# Patient Record
Sex: Male | Born: 1995 | Race: White | Hispanic: No | Marital: Single | Smoking: Never smoker
Health system: Southern US, Community
[De-identification: ages and names within clinical notes are randomized; demographics above are authoritative.]

## PROBLEM LIST (undated history)

## (undated) DIAGNOSIS — F419 Anxiety disorder, unspecified: Secondary | ICD-10-CM

## (undated) DIAGNOSIS — F909 Attention-deficit hyperactivity disorder, unspecified type: Secondary | ICD-10-CM

## (undated) DIAGNOSIS — F840 Autistic disorder: Secondary | ICD-10-CM

## (undated) DIAGNOSIS — Z8489 Family history of other specified conditions: Secondary | ICD-10-CM

## (undated) DIAGNOSIS — I10 Essential (primary) hypertension: Secondary | ICD-10-CM

## (undated) HISTORY — PX: ADENOIDECTOMY: SUR15

## (undated) HISTORY — PX: TYMPANOSTOMY TUBE PLACEMENT: SHX32

---
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
## 2015-02-02 ENCOUNTER — Institutional Professional Consult (permissible substitution): Payer: Managed Care, Other (non HMO) | Admitting: Pediatrics

## 2015-02-02 DIAGNOSIS — F840 Autistic disorder: Secondary | ICD-10-CM | POA: Diagnosis not present

## 2015-02-02 DIAGNOSIS — F411 Generalized anxiety disorder: Secondary | ICD-10-CM | POA: Diagnosis not present

## 2015-02-02 DIAGNOSIS — F902 Attention-deficit hyperactivity disorder, combined type: Secondary | ICD-10-CM | POA: Diagnosis not present

## 2015-04-27 ENCOUNTER — Institutional Professional Consult (permissible substitution) (INDEPENDENT_AMBULATORY_CARE_PROVIDER_SITE_OTHER): Payer: Managed Care, Other (non HMO) | Admitting: Pediatrics

## 2015-04-27 DIAGNOSIS — F902 Attention-deficit hyperactivity disorder, combined type: Secondary | ICD-10-CM | POA: Diagnosis not present

## 2015-04-27 DIAGNOSIS — F840 Autistic disorder: Secondary | ICD-10-CM | POA: Diagnosis not present

## 2015-05-12 ENCOUNTER — Encounter (HOSPITAL_COMMUNITY): Payer: Self-pay | Admitting: *Deleted

## 2015-05-12 ENCOUNTER — Emergency Department (HOSPITAL_COMMUNITY): Payer: Managed Care, Other (non HMO)

## 2015-05-12 ENCOUNTER — Emergency Department (HOSPITAL_COMMUNITY)
Admission: EM | Admit: 2015-05-12 | Discharge: 2015-05-13 | Disposition: A | Discharge status: Home / Self Care | Payer: Managed Care, Other (non HMO) | Attending: Emergency Medicine | Admitting: Emergency Medicine

## 2015-05-12 DIAGNOSIS — Y9389 Activity, other specified: Secondary | ICD-10-CM | POA: Insufficient documentation

## 2015-05-12 DIAGNOSIS — F840 Autistic disorder: Secondary | ICD-10-CM | POA: Diagnosis not present

## 2015-05-12 DIAGNOSIS — Y92009 Unspecified place in unspecified non-institutional (private) residence as the place of occurrence of the external cause: Secondary | ICD-10-CM | POA: Diagnosis not present

## 2015-05-12 DIAGNOSIS — Z79899 Other long term (current) drug therapy: Secondary | ICD-10-CM | POA: Diagnosis not present

## 2015-05-12 DIAGNOSIS — W091XXA Fall from playground swing, initial encounter: Secondary | ICD-10-CM | POA: Diagnosis not present

## 2015-05-12 DIAGNOSIS — S82841A Displaced bimalleolar fracture of right lower leg, initial encounter for closed fracture: Secondary | ICD-10-CM

## 2015-05-12 DIAGNOSIS — Y998 Other external cause status: Secondary | ICD-10-CM | POA: Diagnosis not present

## 2015-05-12 DIAGNOSIS — S99911A Unspecified injury of right ankle, initial encounter: Secondary | ICD-10-CM | POA: Diagnosis present

## 2015-05-12 HISTORY — DX: Autistic disorder: F84.0

## 2015-05-12 MED ORDER — PROPOFOL 10 MG/ML IV BOLUS: 2.0000 mg/kg | Freq: Once | INTRAVENOUS | Status: DC

## 2015-05-12 MED ORDER — ETOMIDATE 2 MG/ML IV SOLN
0.3000 mg/kg | Freq: Once | INTRAVENOUS | Status: AC
  Filled 2015-05-12: qty 20

## 2015-05-12 MED ORDER — MIDAZOLAM HCL 2 MG/2ML IJ SOLN
1.0000 mg | Freq: Once | INTRAMUSCULAR | Status: AC
  Filled 2015-05-12: qty 2

## 2015-05-12 MED ORDER — MIDAZOLAM HCL 2 MG/2ML IJ SOLN
INTRAMUSCULAR | Status: AC | PRN
  Administered 2015-05-12: 1 mg via INTRAVENOUS

## 2015-05-12 MED ORDER — HYDROCODONE-ACETAMINOPHEN 5-325 MG PO TABS: 2.0000 | ORAL_TABLET | ORAL | Status: DC | PRN

## 2015-05-12 MED ORDER — MIDAZOLAM HCL 2 MG/2ML IJ SOLN
INTRAMUSCULAR | Status: DC | PRN
  Administered 2015-05-12: 1 mg via INTRAVENOUS

## 2015-05-12 MED ORDER — ETOMIDATE 2 MG/ML IV SOLN
INTRAVENOUS | Status: AC | PRN
  Administered 2015-05-12: 10 mg via INTRAVENOUS

## 2015-05-12 MED ORDER — LIDOCAINE HCL (PF) 1 % IJ SOLN
10.0000 mL | Freq: Once | INTRAMUSCULAR | Status: DC
  Filled 2015-05-12: qty 10

## 2015-05-12 NOTE — Progress Notes (Signed)
Orthopedic Tech Progress Note Patient Details:  Fernando Davies Jul 31, 1995 [id number]  Ortho Devices Type of Ortho Device: Ace wrap, Post (short leg) splint, Stirrup splint Ortho Device/Splint Location: RLE Ortho Device/Splint Interventions: Ordered, Application   Jennye Moccasin 05/12/2015, 10:34 PM

## 2015-05-12 NOTE — ED Notes (Signed)
Pt to ED from home by EMS with ankle injury. Pt was swinging on a swing at home when the chain broke, pt's ankle was up under the swing. Splint in place, obvious deformity noted to R ankle. EMS gave of fentanyl enroute. CMS intact

## 2015-05-12 NOTE — ED Provider Notes (Signed)
CSN: [id number]     Arrival date & time 05/12/15  2003 History   First MD Initiated Contact with Patient 05/12/15 2006     Chief Complaint  Patient presents with  . Ankle Injury     (Consider location/radiation/quality/duration/timing/severity/associated sxs/prior Treatment) HPI  20 y M w PMH autism, no known allergies who was on a swing this evening when the chain holding the swing broke and he landed on his R foot.  Obvious deformity to R ankle was noticed by ems.  He was HDS en route to the hospital and received fentanyl PTA.  He is complaining of pain around his R ankle, that is 4/10, no other sx.  Past Medical History  Diagnosis Date  . Autism    Past Surgical History  Procedure Laterality Date  . Tympanostomy tube placement    . Adenoidectomy     History reviewed. No pertinent family history. Social History  Substance Use Topics  . Smoking status: Never Smoker   . Smokeless tobacco: None  . Alcohol Use: No    Review of Systems  Constitutional: Negative for fever and chills.  Eyes: Negative for redness.  Respiratory: Negative for cough and shortness of breath.   Cardiovascular: Negative for chest pain.  Gastrointestinal: Negative for nausea, vomiting, abdominal pain and diarrhea.  Genitourinary: Negative for dysuria.  Musculoskeletal:       R ankle pain  Skin: Negative for rash.  Neurological: Negative for headaches.  All other systems reviewed and are negative.     Allergies  Review of patient's allergies indicates no known allergies.  Home Medications   Prior to Admission medications   Medication Sig Start Date End Date Taking? Authorizing Provider  cloNIDine (CATAPRES) 0.3 MG tablet Take 1 tablet by mouth daily. 04/27/15  Yes Historical Provider, MD  FLUoxetine (PROZAC) 20 MG tablet Take 1 tablet by mouth daily. 04/27/15  Yes Historical Provider, MD  Melatonin 10 MG TABS Take 1 tablet by mouth daily.   Yes Historical Provider, MD  methylphenidate 54 MG PO  CR tablet Take 1 tablet by mouth daily. 05/08/15  Yes Historical Provider, MD  HYDROcodone-acetaminophen (NORCO/VICODIN) 5-325 MG tablet Take 2 tablets by mouth every 4 (four) hours as needed. 05/12/15   Silas Flood, MD   BP 138/98 mmHg  Pulse 90  Temp(Src) 98.5 F (36.9 C) (Oral)  Resp 18  Ht  (1.575 m)  Wt 74.844 kg  BMI 30.17 kg/m2  SpO2 99% Physical Exam  Constitutional: He is oriented to person, place, and time. No distress.  HENT:  Head: Normocephalic and atraumatic.  Eyes: EOM are normal. Pupils are equal, round, and reactive to light.  Neck: Normal range of motion. Neck supple.  Cardiovascular: Normal rate.   Pulmonary/Chest: Effort normal. No respiratory distress.  Abdominal: Soft. There is no tenderness.  Musculoskeletal: Normal range of motion.  Palpable R dp pulse.  Intact motor/sensory function in R foot.  Obvious deformity of R ankle with some blanched skin around the medial malleolus.  Neurological: He is alert and oriented to person, place, and time.  Skin: No rash noted. He is not diaphoretic.  Psychiatric: He has a normal mood and affect.    ED Course  .Sedation Date/Time: 05/13/2015 12:40 AM Performed by: Silas Flood Authorized by: Silas Flood  Consent:    Consent obtained:  Verbal and written   Consent given by:  Patient and parent   Risks discussed:  Allergic reaction, prolonged hypoxia resulting in organ damage, prolonged sedation  necessitating reversal, respiratory compromise necessitating ventilatory assistance and intubation, nausea and vomiting   Alternatives discussed:  Analgesia without sedation Indications:    Sedation purpose:  Fracture reduction   Procedure necessitating sedation performed by:  Physician performing sedation   Intended level of sedation:  Moderate (conscious sedation) Pre-sedation assessment:    ASA classification: class 1 - normal, healthy patient     Neck mobility: normal     Mouth opening:  3 or more finger widths    Mallampati score:  I - soft palate, uvula, fauces, pillars visible Immediate pre-procedure details:    Reviewed: vital signs     Verified: bag valve mask available, emergency equipment available, intubation equipment available, IV patency confirmed and oxygen available   Procedure details (see MAR for exact dosages):    Sedation:  Etomidate   Intra-procedure events: none   Reduction of fracture Date/Time: 05/13/2015 12:42 AM Performed by: Silas Flood Authorized by: Silas Flood Consent: Verbal consent obtained. Risks and benefits: risks, benefits and alternatives were discussed Consent given by: patient and parent Patient identity confirmed: verbally with patient Time out: Immediately prior to procedure a "time out" was called to verify the correct patient, procedure, equipment, support staff and site/side marked as required. Patient sedated: yes Sedatives: etomidate Patient tolerance: Patient tolerated the procedure well with no immediate complications   (including critical care time) Labs Review Labs Reviewed - No data to display  Imaging Review Dg Tibia/fibula Right  05/12/2015  CLINICAL DATA:  20 year old male with history of trauma after jumping off of a swing landing heart onto the right ankle. EXAM: RIGHT TIBIA AND FIBULA - 2 VIEW COMPARISON:  No priors. FINDINGS: Three views of the right tibia and fibula demonstrate an acute fracture/ dislocation of the right ankle joint involving the medial and lateral malleoli. Proximal aspects of the tibia and fibula are otherwise intact. IMPRESSION: Acute bimalleolar fracture dislocation of the right ankle, fully described on associated ankle radiograph 05/12/2015, see that report for full details. Proximal right tibia and fibula are otherwise intact. Electronically Signed   By: Trudie Reed M.D.   On: 05/12/2015 21:00   Dg Ankle 2 Views Right  05/12/2015  CLINICAL DATA:  Status post reduction of ankle fracture. Initial encounter. EXAM:  RIGHT ANKLE - 2 VIEW COMPARISON:  Right ankle radiographs performed earlier today at 8:29 p.m. FINDINGS: There is has been interval reduction of the talus, with mild residual lateral displacement, and medial widening of the ankle mortise. There is mild residual lateral displacement of the medial malleolus, and mild posterior displacement of the lateral malleolar and posterior malleolar fragments. Thin osseous fragments projecting anterior to the talar dome may arise from the fibular fracture. Surrounding soft tissue swelling is noted. The soft tissues are difficult to fully assess due to the overlying splint. IMPRESSION: Interval reduction of the talus, with mild residual lateral displacement, and medial widening of the ankle mortise. Mild residual lateral displacement of the medial malleolus, and mild posterior displacement of lateral malleolar and posterior malleolar fragments. Thin osseous fragments projecting anterior to the talar dome may arise from the fibular fracture. Electronically Signed   By: Roanna Raider M.D.   On: 05/12/2015 22:45   Dg Ankle Complete Right  05/12/2015  CLINICAL DATA:  20 year old male with history of trauma after jumping off a swing landing on the right ankle. EXAM: RIGHT ANKLE - COMPLETE 3+ VIEW COMPARISON:  No priors. FINDINGS: Widely displaced lateral malleolus fracture with approximately 15 mm of lateral displacement.  Displaced and angulated fracture of the medial malleoli this, which is approximately 1/2 shaft width laterally displaced and angulated approximately 30 degrees laterally. Total disruption of the ankle mortise, with lateral dislocation of the talus relative to the distal tibia approximately 2 cm. Extensive soft tissue swelling around the ankle joint. IMPRESSION: 1. Severe bimalleolar fracture dislocation of the right ankle, as above. Electronically Signed   By: Trudie Reed M.D.   On: 05/12/2015 20:58   I have personally reviewed and evaluated these images  and lab results as part of my medical decision-making.   EKG Interpretation None      MDM   Final diagnoses:  Ankle fracture, bimalleolar, closed, right, initial encounter    3 y M w PMH autism, no known allergies who was on a swing this evening when the chain holding the swing broke and he landed on his R foot.   Obvious deformity to the R ankle  Plain films show R ankle frx.  Have done procedural sedation with reduction as documented above and placed ankle in splint.  Post-reduction films with good reduction.  Patient woke up from sedation without issue.  Post-reduction exam with good cap-refill in the toes and intact motor/sensory function in the toes of the R foot.  Will have patient f/u with ortho  I have discussed the results, Dx and Tx plan with the patient. They expressed understanding and agree with the plan and were told to return to ED with any worsening of condition or concern.    Disposition: Discharge  Condition: Good  New Prescriptions   HYDROCODONE-ACETAMINOPHEN (NORCO/VICODIN) 5-325 MG TABLET    Take 2 tablets by mouth every 4 (four) hours as needed.    Follow Up: Kathryne Hitch, MD [address] [place] [address] [phone]  In 1 week Please call clinic tomorrow to make an appointment   Pt seen in conjunction with Dr. Marga Hoots, MD 05/13/15 2130  Arby Barrette, MD 05/16/15 1137

## 2015-05-12 NOTE — Discharge Instructions (Signed)
Ankle Fracture °A fracture is a break in a bone. The ankle joint is made up of three bones. These include the lower (distal) sections of your lower leg bones, called the tibia and fibula, along with a bone in your foot, called the talus. Depending on how bad the break is and if more than one ankle joint bone is broken, a cast or splint is used to protect and keep your injured bone from moving while it heals. Sometimes, surgery is required to help the fracture heal properly.  °There are two general types of fractures: °· Stable fracture. This includes a single fracture line through one bone, with no injury to ankle ligaments. A fracture of the talus that does not have any displacement (movement of the bone on either side of the fracture line) is also stable. °· Unstable fracture. This includes more than one fracture line through one or more bones in the ankle joint. It also includes fractures that have displacement of the bone on either side of the fracture line. °CAUSES °· A direct blow to the ankle.   °· Quickly and severely twisting your ankle. °· Trauma, such as a car accident or falling from a significant height. °RISK FACTORS °You may be at a higher risk of ankle fracture if: °· You have certain medical conditions. °· You are involved in high-impact sports. °· You are involved in a high-impact car accident. °SIGNS AND SYMPTOMS  °· Tender and swollen ankle. °· Bruising around the injured ankle. °· Pain on movement of the ankle. °· Difficulty walking or putting weight on the ankle. °· A cold foot below the site of the ankle injury. This can occur if the blood vessels passing through your injured ankle were also damaged. °· Numbness in the foot below the site of the ankle injury. °DIAGNOSIS  °An ankle fracture is usually diagnosed with a physical exam and X-rays. A CT scan may also be required for complex fractures. °TREATMENT  °Stable fractures are treated with a cast or splint and using crutches to avoid putting  weight on your injured ankle. This is followed by an ankle strengthening program. Some patients require a special type of cast, depending on other medical problems they may have. Unstable fractures require surgery to ensure the bones heal properly. Your health care provider will tell you what type of fracture you have and the best treatment for your condition. °HOME CARE INSTRUCTIONS  °· Review correct crutch use with your health care provider and use your crutches as directed. Safe use of crutches is extremely important. Misuse of crutches can cause you to fall or cause injury to nerves in your hands or armpits. °· Do not put weight or pressure on the injured ankle until directed by your health care provider. °· To lessen the swelling, keep the injured leg elevated while sitting or lying down. °· Apply ice to the injured area: °¨ Put ice in a plastic bag. °¨ Place a towel between your cast and the bag. °¨ Leave the ice on for 20 minutes, 2-3 times a day. °· If you have a plaster or fiberglass cast: °¨ Do not try to scratch the skin under the cast with any objects. This can increase your risk of skin infection. °¨ Check the skin around the cast every day. You may put lotion on any red or sore areas. °¨ Keep your cast dry and clean. °· If you have a plaster splint: °¨ Wear the splint as directed. °¨ You may loosen the elastic   around the splint if your toes become numb, tingle, or turn cold or blue. °· Do not put pressure on any part of your cast or splint; it may break. Rest your cast only on a pillow the first 24 hours until it is fully hardened. °· Your cast or splint can be protected during bathing with a plastic bag sealed to your skin with medical tape. Do not lower the cast or splint into water. °· Take medicines as directed by your health care provider. Only take over-the-counter or prescription medicines for pain, discomfort, or fever as directed by your health care provider. °· Do not drive a vehicle until  your health care provider specifically tells you it is safe to do so. °· If your health care provider has given you a follow-up appointment, it is very important to keep that appointment. Not keeping the appointment could result in a chronic or permanent injury, pain, and disability. If you have any problem keeping the appointment, call the facility for assistance. °SEEK MEDICAL CARE IF: °You develop increased swelling or discomfort. °SEEK IMMEDIATE MEDICAL CARE IF:  °· Your cast gets damaged or breaks. °· You have continued severe pain. °· You develop new pain or swelling after the cast was put on. °· Your skin or toenails below the injury turn blue or gray. °· Your skin or toenails below the injury feel cold, numb, or have loss of sensitivity to touch. °· There is a bad smell or pus draining from under the cast. °MAKE SURE YOU:  °· Understand these instructions. °· Will watch your condition. °· Will get help right away if you are not doing well or get worse. °  °This information is not intended to replace advice given to you by your health care provider. Make sure you discuss any questions you have with your health care provider. °  °Document Released: 04/06/2000 Document Revised: 04/14/2013 Document Reviewed: 11/06/2012 °Elsevier Interactive Patient Education ©2016 [employer] ° °Cast or Splint Care °Casts and splints support injured limbs and keep bones from moving while they heal. It is important to care for your cast or splint at home.   °HOME CARE INSTRUCTIONS °· Keep the cast or splint uncovered during the drying period. It can take 24 to 48 hours to dry if it is made of plaster. A fiberglass cast will dry in less than 1 hour. °· Do not rest the cast on anything harder than a pillow for the first 24 hours. °· Do not put weight on your injured limb or apply pressure to the cast until your health care provider gives you permission. °· Keep the cast or splint dry. Wet casts or splints can lose their shape and  may not support the limb as well. A wet cast that has lost its shape can also create harmful pressure on your skin when it dries. Also, wet skin can become infected. °¨ Cover the cast or splint with a plastic bag when bathing or when out in the rain or snow. If the cast is on the trunk of the body, take sponge baths until the cast is removed. °¨ If your cast does become wet, dry it with a towel or a blow dryer on the cool setting only. °· Keep your cast or splint clean. Soiled casts may be wiped with a moistened cloth. °· Do not place any hard or soft foreign objects under your cast or splint, such as cotton, toilet paper, lotion, or powder. °· Do not try to scratch the   skin under the cast with any object. The object could get stuck inside the cast. Also, scratching could lead to an infection. If itching is a problem, use a blow dryer on a cool setting to relieve discomfort. °· Do not trim or cut your cast or remove padding from inside of it. °· Exercise all joints next to the injury that are not immobilized by the cast or splint. For example, if you have a long leg cast, exercise the hip joint and toes. If you have an arm cast or splint, exercise the shoulder, elbow, thumb, and fingers. °· Elevate your injured arm or leg on 1 or 2 pillows for the first 1 to 3 days to decrease swelling and pain. It is best if you can comfortably elevate your cast so it is higher than your heart. °SEEK MEDICAL CARE IF:  °· Your cast or splint cracks. °· Your cast or splint is too tight or too loose. °· You have unbearable itching inside the cast. °· Your cast becomes wet or develops a soft spot or area. °· You have a bad smell coming from inside your cast. °· You get an object stuck under your cast. °· Your skin around the cast becomes red or raw. °· You have new pain or worsening pain after the cast has been applied. °SEEK IMMEDIATE MEDICAL CARE IF:  °· You have fluid leaking through the cast. °· You are unable to move your fingers  or toes. °· You have discolored (blue or white), cool, painful, or very swollen fingers or toes beyond the cast. °· You have tingling or numbness around the injured area. °· You have severe pain or pressure under the cast. °· You have any difficulty with your breathing or have shortness of breath. °· You have chest pain. °  °This information is not intended to replace advice given to you by your health care provider. Make sure you discuss any questions you have with your health care provider. °  °Document Released: 04/06/2000 Document Revised: 01/28/2013 Document Reviewed: 10/16/2012 °Elsevier Interactive Patient Education ©2016 [employer] ° °

## 2015-05-12 NOTE — Sedation Documentation (Addendum)
RT, ortho tech, Dr.Pfiefffer and Dr.Proulx at bedside. Mother remains at bedside until procedure.

## 2015-05-12 NOTE — Sedation Documentation (Signed)
R ankle Reduction successful without difficulty; splint placed by ortho tech

## 2015-05-12 NOTE — ED Notes (Signed)
Pt appears uncomfortable after xrays, requesting "a little something." MD made aware

## 2015-05-16 ENCOUNTER — Encounter (HOSPITAL_COMMUNITY): Payer: Self-pay | Admitting: *Deleted

## 2015-05-16 ENCOUNTER — Other Ambulatory Visit (HOSPITAL_COMMUNITY): Payer: Self-pay | Admitting: Orthopaedic Surgery

## 2015-05-17 ENCOUNTER — Ambulatory Visit (HOSPITAL_COMMUNITY)
Admission: RE | Admit: 2015-05-17 | Discharge: 2015-05-18 | Disposition: A | Discharge status: Home / Self Care | Payer: Managed Care, Other (non HMO) | Source: Ambulatory Visit | Attending: Orthopaedic Surgery | Admitting: Orthopaedic Surgery

## 2015-05-17 ENCOUNTER — Encounter (HOSPITAL_COMMUNITY): Payer: Self-pay

## 2015-05-17 ENCOUNTER — Ambulatory Visit (HOSPITAL_COMMUNITY): Payer: Managed Care, Other (non HMO) | Admitting: Anesthesiology

## 2015-05-17 ENCOUNTER — Ambulatory Visit (HOSPITAL_COMMUNITY): Payer: Managed Care, Other (non HMO)

## 2015-05-17 ENCOUNTER — Encounter (HOSPITAL_COMMUNITY)
Admission: RE | Disposition: A | Discharge status: Home / Self Care | Payer: Self-pay | Source: Ambulatory Visit | Attending: Orthopaedic Surgery

## 2015-05-17 DIAGNOSIS — F840 Autistic disorder: Secondary | ICD-10-CM | POA: Diagnosis not present

## 2015-05-17 DIAGNOSIS — I10 Essential (primary) hypertension: Secondary | ICD-10-CM | POA: Insufficient documentation

## 2015-05-17 DIAGNOSIS — W1789XA Other fall from one level to another, initial encounter: Secondary | ICD-10-CM | POA: Insufficient documentation

## 2015-05-17 DIAGNOSIS — S82853A Displaced trimalleolar fracture of unspecified lower leg, initial encounter for closed fracture: Secondary | ICD-10-CM | POA: Diagnosis present

## 2015-05-17 DIAGNOSIS — S82851A Displaced trimalleolar fracture of right lower leg, initial encounter for closed fracture: Secondary | ICD-10-CM | POA: Diagnosis not present

## 2015-05-17 DIAGNOSIS — Z419 Encounter for procedure for purposes other than remedying health state, unspecified: Secondary | ICD-10-CM

## 2015-05-17 DIAGNOSIS — F909 Attention-deficit hyperactivity disorder, unspecified type: Secondary | ICD-10-CM | POA: Diagnosis not present

## 2015-05-17 HISTORY — PX: ORIF ANKLE FRACTURE: SUR919

## 2015-05-17 HISTORY — DX: Attention-deficit hyperactivity disorder, unspecified type: F90.9

## 2015-05-17 HISTORY — DX: Essential (primary) hypertension: I10

## 2015-05-17 HISTORY — DX: Anxiety disorder, unspecified: F41.9

## 2015-05-17 HISTORY — DX: Family history of other specified conditions: Z84.89

## 2015-05-17 HISTORY — PX: ORIF ANKLE FRACTURE: SHX5408

## 2015-05-17 LAB — SURGICAL PCR SCREEN
MRSA, PCR: NEGATIVE
STAPHYLOCOCCUS AUREUS: POSITIVE — AB

## 2015-05-17 LAB — CBC
HCT: 42.6 % (ref 39.0–52.0)
HEMOGLOBIN: 15.5 g/dL (ref 13.0–17.0)
MCH: 31.1 pg (ref 26.0–34.0)
MCHC: 36.4 g/dL — AB (ref 30.0–36.0)
MCV: 85.4 fL (ref 78.0–100.0)
Platelets: 327 10*3/uL (ref 150–400)
RBC: 4.99 MIL/uL (ref 4.22–5.81)
RDW: 12.3 % (ref 11.5–15.5)
WBC: 9 10*3/uL (ref 4.0–10.5)

## 2015-05-17 LAB — BASIC METABOLIC PANEL
ANION GAP: 11 (ref 5–15)
BUN: 9 mg/dL (ref 6–20)
CALCIUM: 9.7 mg/dL (ref 8.9–10.3)
CO2: 23 mmol/L (ref 22–32)
Chloride: 106 mmol/L (ref 101–111)
Creatinine, Ser: 0.88 mg/dL (ref 0.61–1.24)
Glucose, Bld: 87 mg/dL (ref 65–99)
Potassium: 4.1 mmol/L (ref 3.5–5.1)
SODIUM: 140 mmol/L (ref 135–145)

## 2015-05-17 SURGERY — OPEN REDUCTION INTERNAL FIXATION (ORIF) ANKLE FRACTURE
Anesthesia: General | Site: Ankle | Laterality: Right

## 2015-05-17 MED ORDER — ONDANSETRON HCL 4 MG/2ML IJ SOLN: 4.0000 mg | Freq: Four times a day (QID) | INTRAMUSCULAR | Status: DC | PRN

## 2015-05-17 MED ORDER — FENTANYL CITRATE (PF) 250 MCG/5ML IJ SOLN
INTRAMUSCULAR | Status: AC
  Filled 2015-05-17: qty 5

## 2015-05-17 MED ORDER — METHOCARBAMOL 1000 MG/10ML IJ SOLN: 500.0000 mg | Freq: Four times a day (QID) | INTRAMUSCULAR | Status: DC | PRN

## 2015-05-17 MED ORDER — MIDAZOLAM HCL 2 MG/2ML IJ SOLN
INTRAMUSCULAR | Status: AC
Start: 2015-05-17 — End: 2015-05-17
  Filled 2015-05-17: qty 2

## 2015-05-17 MED ORDER — LACTATED RINGERS IV SOLN
INTRAVENOUS | Status: DC | PRN
  Administered 2015-05-17 (×2): via INTRAVENOUS

## 2015-05-17 MED ORDER — ROCURONIUM BROMIDE 50 MG/5ML IV SOLN
INTRAVENOUS | Status: AC
  Filled 2015-05-17: qty 1

## 2015-05-17 MED ORDER — KETOROLAC TROMETHAMINE 30 MG/ML IJ SOLN
INTRAMUSCULAR | Status: AC
  Filled 2015-05-17: qty 1

## 2015-05-17 MED ORDER — CEFAZOLIN SODIUM-DEXTROSE 2-3 GM-% IV SOLR
2.0000 g | Freq: Four times a day (QID) | INTRAVENOUS | Status: DC
  Administered 2015-05-18 (×2): 2 g via INTRAVENOUS
  Filled 2015-05-17 (×3): qty 50

## 2015-05-17 MED ORDER — HYDROMORPHONE HCL 1 MG/ML IJ SOLN
0.2500 mg | INTRAMUSCULAR | Status: DC | PRN
  Administered 2015-05-17 (×2): 0.25 mg via INTRAVENOUS

## 2015-05-17 MED ORDER — LACTATED RINGERS IV SOLN
INTRAVENOUS | Status: DC
  Administered 2015-05-17: 15:00:00 via INTRAVENOUS

## 2015-05-17 MED ORDER — DEXAMETHASONE SODIUM PHOSPHATE 4 MG/ML IJ SOLN
INTRAMUSCULAR | Status: AC
Start: 2015-05-17 — End: 2015-05-17
  Filled 2015-05-17: qty 1

## 2015-05-17 MED ORDER — LIDOCAINE HCL (CARDIAC) 20 MG/ML IV SOLN
INTRAVENOUS | Status: DC | PRN
  Administered 2015-05-17: 100 mg via INTRATRACHEAL

## 2015-05-17 MED ORDER — PROPOFOL 10 MG/ML IV BOLUS
INTRAVENOUS | Status: DC | PRN
  Administered 2015-05-17: 200 mg via INTRAVENOUS

## 2015-05-17 MED ORDER — ONDANSETRON HCL 4 MG/2ML IJ SOLN
INTRAMUSCULAR | Status: DC | PRN
  Administered 2015-05-17: 4 mg via INTRAVENOUS

## 2015-05-17 MED ORDER — MELATONIN 3 MG PO TABS
9.0000 mg | ORAL_TABLET | Freq: Every day | ORAL | Status: DC
  Administered 2015-05-17: 9 mg via ORAL
  Filled 2015-05-17 (×2): qty 3

## 2015-05-17 MED ORDER — NEOSTIGMINE METHYLSULFATE 10 MG/10ML IV SOLN
INTRAVENOUS | Status: AC
Start: 2015-05-17 — End: 2015-05-17
  Filled 2015-05-17: qty 1

## 2015-05-17 MED ORDER — METHYLPHENIDATE HCL ER (OSM) 36 MG PO TBCR: 54.0000 mg | EXTENDED_RELEASE_TABLET | Freq: Every day | ORAL | Status: DC

## 2015-05-17 MED ORDER — ONDANSETRON HCL 4 MG PO TABS: 4.0000 mg | ORAL_TABLET | Freq: Four times a day (QID) | ORAL | Status: DC | PRN

## 2015-05-17 MED ORDER — GLYCOPYRROLATE 0.2 MG/ML IJ SOLN
INTRAMUSCULAR | Status: AC
  Filled 2015-05-17: qty 2

## 2015-05-17 MED ORDER — FENTANYL CITRATE (PF) 100 MCG/2ML IJ SOLN
INTRAMUSCULAR | Status: DC | PRN
  Administered 2015-05-17: 100 ug via INTRAVENOUS

## 2015-05-17 MED ORDER — HYDROMORPHONE HCL 1 MG/ML IJ SOLN: 0.5000 mg | INTRAMUSCULAR | Status: DC | PRN

## 2015-05-17 MED ORDER — ACETAMINOPHEN 650 MG RE SUPP: 650.0000 mg | Freq: Four times a day (QID) | RECTAL | Status: DC | PRN

## 2015-05-17 MED ORDER — METOCLOPRAMIDE HCL 5 MG/ML IJ SOLN: 5.0000 mg | Freq: Three times a day (TID) | INTRAMUSCULAR | Status: DC | PRN

## 2015-05-17 MED ORDER — CEFAZOLIN SODIUM-DEXTROSE 2-3 GM-% IV SOLR
2.0000 g | INTRAVENOUS | Status: AC
  Administered 2015-05-17: 2 g via INTRAVENOUS
  Filled 2015-05-17: qty 50

## 2015-05-17 MED ORDER — HYDROMORPHONE HCL 1 MG/ML IJ SOLN
INTRAMUSCULAR | Status: AC
  Filled 2015-05-17: qty 1

## 2015-05-17 MED ORDER — 0.9 % SODIUM CHLORIDE (POUR BTL) OPTIME
TOPICAL | Status: DC | PRN
  Administered 2015-05-17: 1000 mL

## 2015-05-17 MED ORDER — CLONIDINE HCL 0.2 MG PO TABS
0.3000 mg | ORAL_TABLET | Freq: Every day | ORAL | Status: DC
  Administered 2015-05-17: 0.3 mg via ORAL
  Filled 2015-05-17: qty 1

## 2015-05-17 MED ORDER — DEXAMETHASONE SODIUM PHOSPHATE 4 MG/ML IJ SOLN
INTRAMUSCULAR | Status: DC | PRN
  Administered 2015-05-17: 4 mg via INTRAVENOUS

## 2015-05-17 MED ORDER — KETOROLAC TROMETHAMINE 30 MG/ML IJ SOLN
30.0000 mg | Freq: Once | INTRAMUSCULAR | Status: AC
  Administered 2015-05-17: 30 mg via INTRAVENOUS

## 2015-05-17 MED ORDER — FLUOXETINE HCL 20 MG PO CAPS
20.0000 mg | ORAL_CAPSULE | Freq: Every day | ORAL | Status: DC
  Administered 2015-05-18: 20 mg via ORAL
  Filled 2015-05-17: qty 1

## 2015-05-17 MED ORDER — BUPIVACAINE HCL (PF) 0.25 % IJ SOLN
INTRAMUSCULAR | Status: AC
Start: 2015-05-17 — End: 2015-05-17
  Filled 2015-05-17: qty 30

## 2015-05-17 MED ORDER — METHOCARBAMOL 500 MG PO TABS
500.0000 mg | ORAL_TABLET | Freq: Four times a day (QID) | ORAL | Status: DC | PRN
  Administered 2015-05-18: 500 mg via ORAL
  Filled 2015-05-17: qty 1

## 2015-05-17 MED ORDER — BUPIVACAINE HCL (PF) 0.25 % IJ SOLN
INTRAMUSCULAR | Status: DC | PRN
Start: 2015-05-17 — End: 2015-05-17
  Administered 2015-05-17: 10 mL

## 2015-05-17 MED ORDER — PROMETHAZINE HCL 25 MG/ML IJ SOLN: 6.2500 mg | INTRAMUSCULAR | Status: DC | PRN

## 2015-05-17 MED ORDER — METHYLPHENIDATE HCL ER 18 MG PO TB24
54.0000 mg | ORAL_TABLET | Freq: Every day | ORAL | Status: DC
  Administered 2015-05-18: 54 mg via ORAL
  Filled 2015-05-17: qty 3

## 2015-05-17 MED ORDER — PROPOFOL 10 MG/ML IV BOLUS
INTRAVENOUS | Status: AC
  Filled 2015-05-17: qty 20

## 2015-05-17 MED ORDER — ACETAMINOPHEN 325 MG PO TABS: 650.0000 mg | ORAL_TABLET | Freq: Four times a day (QID) | ORAL | Status: DC | PRN

## 2015-05-17 MED ORDER — METOCLOPRAMIDE HCL 5 MG PO TABS: 5.0000 mg | ORAL_TABLET | Freq: Three times a day (TID) | ORAL | Status: DC | PRN

## 2015-05-17 MED ORDER — SODIUM CHLORIDE 0.9 % IV SOLN
INTRAVENOUS | Status: DC
  Administered 2015-05-18: 03:00:00 via INTRAVENOUS

## 2015-05-17 MED ORDER — DIPHENHYDRAMINE HCL 12.5 MG/5ML PO ELIX: 12.5000 mg | ORAL_SOLUTION | ORAL | Status: DC | PRN

## 2015-05-17 MED ORDER — OXYCODONE HCL 5 MG PO TABS
5.0000 mg | ORAL_TABLET | ORAL | Status: DC | PRN
  Administered 2015-05-18 (×3): 10 mg via ORAL
  Filled 2015-05-17 (×3): qty 2

## 2015-05-17 SURGICAL SUPPLY — 67 items
BANDAGE ACE 4X5 VEL STRL LF (GAUZE/BANDAGES/DRESSINGS) ×3 IMPLANT
BANDAGE ACE 6X5 VEL STRL LF (GAUZE/BANDAGES/DRESSINGS) ×3 IMPLANT
BANDAGE ELASTIC 4 VELCRO ST LF (GAUZE/BANDAGES/DRESSINGS) IMPLANT
BANDAGE ELASTIC 6 VELCRO ST LF (GAUZE/BANDAGES/DRESSINGS) IMPLANT
BANDAGE ESMARK 6X9 LF (GAUZE/BANDAGES/DRESSINGS) IMPLANT
BIT DRILL CANN 2.7 (BIT) ×1
BIT DRILL CANN 2.7MM (BIT) ×1
BIT DRILL SRG 2.7XCANN AO CPLG (BIT) ×1 IMPLANT
BIT DRL SRG 2.7XCANN AO CPLNG (BIT) ×1
BNDG COHESIVE 4X5 TAN STRL (GAUZE/BANDAGES/DRESSINGS) ×3 IMPLANT
BNDG ESMARK 6X9 LF (GAUZE/BANDAGES/DRESSINGS)
COVER SURGICAL LIGHT HANDLE (MISCELLANEOUS) ×6 IMPLANT
CUFF TOURNIQUET SINGLE 34IN LL (TOURNIQUET CUFF) ×3 IMPLANT
CUFF TOURNIQUET SINGLE 44IN (TOURNIQUET CUFF) IMPLANT
DRAPE C-ARM 42X72 X-RAY (DRAPES) ×3 IMPLANT
DRAPE C-ARMOR (DRAPES) ×3 IMPLANT
DRAPE U-SHAPE 47X51 STRL (DRAPES) ×3 IMPLANT
DRILL 2.6X122MM WL AO SHAFT (BIT) ×3 IMPLANT
DRSG PAD ABDOMINAL 8X10 ST (GAUZE/BANDAGES/DRESSINGS) ×3 IMPLANT
DURAPREP 26ML APPLICATOR (WOUND CARE) ×3 IMPLANT
ELECT REM PT RETURN 9FT ADLT (ELECTROSURGICAL) ×3
ELECTRODE REM PT RTRN 9FT ADLT (ELECTROSURGICAL) ×1 IMPLANT
GAUZE SPONGE 4X4 12PLY STRL (GAUZE/BANDAGES/DRESSINGS) IMPLANT
GAUZE XEROFORM 5X9 LF (GAUZE/BANDAGES/DRESSINGS) ×3 IMPLANT
GLOVE BIO SURGEON STRL SZ8 (GLOVE) ×3 IMPLANT
GLOVE ORTHO TXT STRL SZ7.5 (GLOVE) ×3 IMPLANT
GOWN STRL REUS W/ TWL LRG LVL3 (GOWN DISPOSABLE) ×2 IMPLANT
GOWN STRL REUS W/ TWL XL LVL3 (GOWN DISPOSABLE) ×4 IMPLANT
GOWN STRL REUS W/TWL LRG LVL3 (GOWN DISPOSABLE) ×4
GOWN STRL REUS W/TWL XL LVL3 (GOWN DISPOSABLE) ×8
K-WIRE ORTHOPEDIC 1.4X150L (WIRE) ×6
KIT BASIN OR (CUSTOM PROCEDURE TRAY) ×3 IMPLANT
KIT ROOM TURNOVER OR (KITS) ×3 IMPLANT
KWIRE ORTHOPEDIC 1.4X150L (WIRE) ×2 IMPLANT
MANIFOLD NEPTUNE II (INSTRUMENTS) ×3 IMPLANT
NEEDLE HYPO 25GX1X1/2 BEV (NEEDLE) IMPLANT
NS IRRIG 1000ML POUR BTL (IV SOLUTION) ×3 IMPLANT
PACK ORTHO EXTREMITY (CUSTOM PROCEDURE TRAY) ×3 IMPLANT
PAD ARMBOARD 7.5X6 YLW CONV (MISCELLANEOUS) ×6 IMPLANT
PAD CAST 4YDX4 CTTN HI CHSV (CAST SUPPLIES) ×1 IMPLANT
PADDING CAST COTTON 4X4 STRL (CAST SUPPLIES) ×2
PADDING CAST COTTON 6X4 STRL (CAST SUPPLIES) ×3 IMPLANT
PLATE FIBULA 4H (Plate) ×3 IMPLANT
PREFILTER NEPTUNE (MISCELLANEOUS) ×3 IMPLANT
SCREW 50X4.0MM (Screw) ×6 IMPLANT
SCREW BONE NON-LCKING 3.5X12MM (Screw) ×9 IMPLANT
SCREW LOCKING 3.5X12 (Screw) ×9 IMPLANT
SPLINT PLASTER CAST XFAST 5X30 (CAST SUPPLIES) ×1 IMPLANT
SPLINT PLASTER XFAST SET 5X30 (CAST SUPPLIES) ×2
SPONGE GAUZE 4X4 12PLY STER LF (GAUZE/BANDAGES/DRESSINGS) ×3 IMPLANT
SPONGE LAP 4X18 X RAY DECT (DISPOSABLE) ×3 IMPLANT
SUCTION FRAZIER HANDLE 10FR (MISCELLANEOUS) ×2
SUCTION TUBE FRAZIER 10FR DISP (MISCELLANEOUS) ×1 IMPLANT
SUT ETHILON 2 0 FS 18 (SUTURE) ×3 IMPLANT
SUT ETHILON 2 0 PSLX (SUTURE) ×6 IMPLANT
SUT VIC AB 0 CT1 27 (SUTURE) ×4
SUT VIC AB 0 CT1 27XBRD ANBCTR (SUTURE) ×2 IMPLANT
SUT VIC AB 2-0 CT1 27 (SUTURE) ×4
SUT VIC AB 2-0 CT1 27XBRD (SUTURE) ×1 IMPLANT
SUT VIC AB 2-0 CT1 TAPERPNT 27 (SUTURE) ×1 IMPLANT
SUT VICRYL 0 CT 1 36IN (SUTURE) ×3 IMPLANT
SYR CONTROL 10ML LL (SYRINGE) IMPLANT
TOWEL OR 17X24 6PK STRL BLUE (TOWEL DISPOSABLE) ×3 IMPLANT
TOWEL OR 17X26 10 PK STRL BLUE (TOWEL DISPOSABLE) ×3 IMPLANT
TUBE CONNECTING 12'X1/4 (SUCTIONS) ×1
TUBE CONNECTING 12X1/4 (SUCTIONS) ×2 IMPLANT
WATER STERILE IRR 1000ML POUR (IV SOLUTION) ×3 IMPLANT

## 2015-05-17 NOTE — Anesthesia Preprocedure Evaluation (Signed)
Anesthesia Evaluation  Patient identified by MRN, date of birth, ID band Patient awake    Reviewed: Allergy & Precautions, NPO status , Patient's Chart, lab work & pertinent test results  Airway Mallampati: II  TM Distance: >3 FB Neck ROM: Full    Dental no notable dental hx.    Pulmonary neg pulmonary ROS,    Pulmonary exam normal breath sounds clear to auscultation       Cardiovascular negative cardio ROS Normal cardiovascular exam Rhythm:Regular Rate:Normal     Neuro/Psych Anxiety negative neurological ROS     GI/Hepatic negative GI ROS, Neg liver ROS,   Endo/Other  negative endocrine ROS  Renal/GU negative Renal ROS  negative genitourinary   Musculoskeletal negative musculoskeletal ROS (+)   Abdominal   Peds negative pediatric ROS (+)  Hematology negative hematology ROS (+)   Anesthesia Other Findings   Reproductive/Obstetrics negative OB ROS                             Anesthesia Physical Anesthesia Plan  ASA: II  Anesthesia Plan: General   Post-op Pain Management:    Induction: Intravenous  Airway Management Planned: LMA  Additional Equipment:   Intra-op Plan:   Post-operative Plan: Extubation in OR  Informed Consent: I have reviewed the patients History and Physical, chart, labs and discussed the procedure including the risks, benefits and alternatives for the proposed anesthesia with the patient or authorized representative who has indicated his/her understanding and acceptance.   Dental advisory given  Plan Discussed with: CRNA and Surgeon  Anesthesia Plan Comments:         Anesthesia Quick Evaluation  

## 2015-05-17 NOTE — Transfer of Care (Signed)
Immediate Anesthesia Transfer of Care Note  Patient: Fernando Davies  Procedure(s) Performed: Procedure(s): OPEN REDUCTION INTERNAL FIXATION (ORIF) RIGHT ANKLE LATERAL AND MEDIAL MALLEOLUS FRACTURES (Right)  Patient Location: PACU  Anesthesia Type:General  Level of Consciousness: sedated, patient cooperative and responds to stimulation  Airway & Oxygen Therapy: Patient Spontanous Breathing and Patient connected to nasal cannula oxygen  Post-op Assessment: Report given to RN, Post -op Vital signs reviewed and stable and Patient moving all extremities X 4  Post vital signs: Reviewed and stable  Last Vitals:  Filed Vitals:   05/17/15 1358  BP: 150/94  Pulse: 98  Temp: 36.8 C  Resp: 18    Complications: No apparent anesthesia complications

## 2015-05-17 NOTE — Anesthesia Procedure Notes (Signed)
Procedure Name: LMA Insertion Date/Time: 05/17/2015 6:31 PM Performed by: Melina Schools Pre-anesthesia Checklist: Patient identified, Emergency Drugs available, Suction available, Patient being monitored and Timeout performed Patient Re-evaluated:Patient Re-evaluated prior to inductionOxygen Delivery Method: Circle system utilized Preoxygenation: Pre-oxygenation with 100% oxygen Intubation Type: IV induction Ventilation: Mask ventilation without difficulty LMA Size: 4.0 Number of attempts: 1 Placement Confirmation: positive ETCO2 and breath sounds checked- equal and bilateral Tube secured with: Tape Dental Injury: Teeth and Oropharynx as per pre-operative assessment

## 2015-05-17 NOTE — H&P (Signed)
Fernando Davies is an 20 y.o. male.   Chief Complaint:   Right ankle pain with known unstable ankle fracture/dislocation HPI:   20 yo male who fell off of a swing in his back yard this past Thursday night.  Was brought to the [facility] ED and found to have a right trimalleolar ankle fracture/dislocation.  This was reduced by the ED staff and appropriately splinted.  He now presents for definitive treatment.  His mother gives informed consent since he is a high functioning autistic.  Past Medical History  Diagnosis Date  . Autism   . Family history of adverse reaction to anesthesia     MGF- difficulty waking up  . Hypertension     not diagnosed  . Anxiety   . ADHD (attention deficit hyperactivity disorder)     Past Surgical History  Procedure Laterality Date  . Tympanostomy tube placement      x3  . Adenoidectomy      No family history on file. Social History:  reports that he has never smoked. He does not have any smokeless tobacco history on file. He reports that he does not drink alcohol or use illicit drugs.  Allergies: No Known Allergies  No prescriptions prior to admission    No results found for this or any previous visit (from the past 48 hour(s)). No results found.  Review of Systems  All other systems reviewed and are negative.   There were no vitals taken for this visit. Physical Exam  Constitutional: He is oriented to person, place, and time. He appears well-developed and well-nourished.  HENT:  Head: Normocephalic and atraumatic.  Eyes: EOM are normal. Pupils are equal, round, and reactive to light.  Neck: Normal range of motion. Neck supple.  Cardiovascular: Normal rate and regular rhythm.   Respiratory: Effort normal and breath sounds normal.  GI: Soft.  Musculoskeletal:       Right ankle: He exhibits decreased range of motion, swelling, ecchymosis and deformity. Tenderness. Lateral malleolus and medial malleolus tenderness found.  Neurological: He is  alert and oriented to person, place, and time.  Skin: Skin is warm.  Psychiatric: He has a normal mood and affect.     Assessment/Plan Right unstable trimalleolar ankle fracture 1)  To the OR today for open reduction/internal fixation of his right trimalleolar ankle fracture followed by overnight admission for IV antibiotics, pain meds, and therapt.  [name] Y 05/17/2015, 1:22 PM

## 2015-05-17 NOTE — Brief Op Note (Signed)
05/17/2015  7:51 PM  PATIENT:  Fernando Davies  20 y.o. male  PRE-OPERATIVE DIAGNOSIS:  right trimalleolar ankle fracture  POST-OPERATIVE DIAGNOSIS:  right trimalleolar ankle fracture  PROCEDURE:  Procedure(s): OPEN REDUCTION INTERNAL FIXATION (ORIF) RIGHT ANKLE LATERAL AND MEDIAL MALLEOLUS FRACTURES (Right)  SURGEON:  Surgeon(s) and Role:    * Kathryne Hitch, MD - Primary  PHYSICIAN ASSISTANT: Rexene Edison, PA-C  ANESTHESIA:   local and general  EBL:  Total I/O In: 1000 [I.V.:1000] Out: -   LOCAL MEDICATIONS USED:  MARCAINE     COUNTS:  YES  DICTATION: .Other Dictation: Dictation Number [id number]  PLAN OF CARE: Admit for overnight observation  PATIENT DISPOSITION:  PACU - hemodynamically stable.   Delay start of Pharmacological VTE agent (>24hrs) due to surgical blood loss or risk of bleeding: no

## 2015-05-17 NOTE — Anesthesia Postprocedure Evaluation (Signed)
Anesthesia Post Note  Patient: Fernando Davies  Procedure(s) Performed: Procedure(s) (LRB): OPEN REDUCTION INTERNAL FIXATION (ORIF) RIGHT ANKLE LATERAL AND MEDIAL MALLEOLUS FRACTURES (Right)  Patient location during evaluation: PACU Anesthesia Type: General Level of consciousness: awake and alert Pain management: pain level controlled Vital Signs Assessment: post-procedure vital signs reviewed and stable Respiratory status: spontaneous breathing Cardiovascular status: blood pressure returned to baseline Anesthetic complications: no    Last Vitals:  Filed Vitals:   05/17/15 2100 05/17/15 2106  BP: 160/108 160/108  Pulse: 99 97  Temp:  36.7 C  Resp: 12 9    Last Pain:  Filed Vitals:   05/17/15 2120  PainSc: 0-No pain                 Kennieth Rad

## 2015-05-18 ENCOUNTER — Encounter (HOSPITAL_COMMUNITY): Payer: Self-pay | Admitting: General Practice

## 2015-05-18 DIAGNOSIS — S82851A Displaced trimalleolar fracture of right lower leg, initial encounter for closed fracture: Secondary | ICD-10-CM | POA: Diagnosis not present

## 2015-05-18 MED ORDER — OXYCODONE-ACETAMINOPHEN 5-325 MG PO TABS: 1.0000 | ORAL_TABLET | ORAL | Status: DC | PRN

## 2015-05-18 MED ORDER — METHOCARBAMOL 500 MG PO TABS: 500.0000 mg | ORAL_TABLET | Freq: Four times a day (QID) | ORAL | Status: DC | PRN

## 2015-05-18 MED ORDER — ASPIRIN EC 325 MG PO TBEC: 325.0000 mg | DELAYED_RELEASE_TABLET | Freq: Once | ORAL | Status: DC

## 2015-05-18 NOTE — Progress Notes (Signed)
Subjective: 1 Day Post-Op Procedure(s) (LRB): OPEN REDUCTION INTERNAL FIXATION (ORIF) RIGHT ANKLE LATERAL AND MEDIAL MALLEOLUS FRACTURES (Right) Patient reports pain as mild to moderate.  No complaints otherwise.  Objective: Vital signs in last 24 hours: Temp:  [98 F (36.7 C)-98.3 F (36.8 C)] 98.1 F (36.7 C) (01/25 0537) Pulse Rate:  [88-105] 88 (01/25 0537) Resp:  [9-23] 16 (01/25 0537) BP: (148-169)/(78-125) 148/78 mmHg (01/25 0537) SpO2:  [94 %-99 %] 99 % (01/25 0537) Weight:  [74.844 kg (165 lb)] 74.844 kg (165 lb) (01/24 1358)  Intake/Output from previous day: 01/24 0701 - 01/25 0700 In: 1582.5 [P.O.:210; I.V.:1272.5; IV Piggyback:100] Out: 1000 [Urine:1000] Intake/Output this shift:     Recent Labs  05/17/15 1511  HGB 15.5    Recent Labs  05/17/15 1511  WBC 9.0  RBC 4.99  HCT 42.6  PLT 327    Recent Labs  05/17/15 1511  NA 140  K 4.1  CL 106  CO2 23  BUN 9  CREATININE 0.88  GLUCOSE 87  CALCIUM 9.7   No results for input(s): LABPT, INR in the last 72 hours.   Right lower leg: Sensation intact distally  Able to wiggle toes Splint clean dry and intact  Assessment/Plan: 1 Day Post-Op Procedure(s) (LRB): OPEN REDUCTION INTERNAL FIXATION (ORIF) RIGHT ANKLE LATERAL AND MEDIAL MALLEOLUS FRACTURES (Right) Up with therapy  Non weight bearing right leg Spoke with patient and mother importance of elevation leg and wiggle toes often. Discharge home after PT Fernando Davies 05/18/2015, 8:35 AM

## 2015-05-18 NOTE — Evaluation (Signed)
Physical Therapy Evaluation Patient Details Name: Fernando Davies MRN: [id number] DOB: 01/09/1996 Today's Date: 05/18/2015   History of Present Illness  20 y.o. male now s/p OPEN REDUCTION INTERNAL FIXATION (ORIF) RIGHT ANKLE LATERAL AND MEDIAL MALLEOLUS FRACTURES on 05/17/2015. PMH: Autism, anxiety, ADHD  Clinical Impression  Patient is s/p above surgery resulting in functional limitations due to the deficits listed below (see PT Problem List).  Patient will benefit from skilled PT to increase their independence and safety with mobility to allow discharge to the venue listed below.  Anticipate patient will D/C to home with family assistance. Patient and mother deny any questions or concerns following PT session. PT to follow acutely to progress mobility as needed.        Follow Up Recommendations No PT follow up;Supervision for mobility/OOB    Equipment Recommendations  None recommended by PT;Other (comment) (patient reports having rw and crutches at home)    Recommendations for Other Services       Precautions / Restrictions Precautions Precautions: Fall Restrictions Weight Bearing Restrictions: Yes RLE Weight Bearing: Non weight bearing      Mobility  Bed Mobility Overal bed mobility: Independent             General bed mobility comments: supine to sit, bed flat  Transfers Overall transfer level: Needs assistance Equipment used: Rolling walker (2 wheeled) Transfers: Sit to/from Stand Sit to Stand: Supervision         General transfer comment: cues for hand placement  Ambulation/Gait Ambulation/Gait assistance: Min guard Ambulation Distance (Feet): 60 Feet Assistive device: Rolling walker (2 wheeled) Gait Pattern/deviations:  (swing-to pattern) Gait velocity: decreased   General Gait Details: no loss of balance, patinet requeting to use rw. States he feels more confident with rw.   Stairs Stairs: Yes Stairs assistance: Min guard Stair Management: No  rails;Forwards;With crutches Number of Stairs: 3 General stair comments: patient's mother present for session, both patient and mother confirm feeling confident with stairs at home.   Wheelchair Mobility    Modified Rankin (Stroke Patients Only)       Balance Overall balance assessment: Needs assistance Sitting-balance support: No upper extremity supported Sitting balance-Leahy Scale: Good     Standing balance support: Bilateral upper extremity supported Standing balance-Leahy Scale: Poor Standing balance comment: using rw                             Pertinent Vitals/Pain Pain Assessment: Faces Faces Pain Scale: Hurts a little bit Pain Location: Rt ankle Pain Descriptors / Indicators: Guarding Pain Intervention(s): Limited activity within patient's tolerance;Monitored during session    Home Living Family/patient expects to be discharged to:: Private residence Living Arrangements: Parent Available Help at Discharge: Family;Available 24 hours/day (parent/grandparents) Type of Home: House Home Access: Stairs to enter Entrance Stairs-Rails: None Entrance Stairs-Number of Steps: 3 Home Layout: Two level;Able to live on main level with bedroom/bathroom Home Equipment: Dan Humphreys - 2 wheels;Crutches Additional Comments: mother checking on walker at home.     Prior Function Level of Independence: Independent with assistive device(s)         Comments: crutches, prior to fall independent     Hand Dominance        Extremity/Trunk Assessment   Upper Extremity Assessment: Overall WFL for tasks assessed           Lower Extremity Assessment: Overall WFL for tasks assessed         Communication  Communication: No difficulties  Cognition Arousal/Alertness: Awake/alert Behavior During Therapy: WFL for tasks assessed/performed Overall Cognitive Status: Within Functional Limits for tasks assessed                      General Comments       Exercises        Assessment/Plan    PT Assessment Patient needs continued PT services  PT Diagnosis Difficulty walking   PT Problem List Decreased activity tolerance;Decreased balance;Decreased mobility;Decreased knowledge of use of DME  PT Treatment Interventions DME instruction;Gait training;Stair training;Functional mobility training;Therapeutic activities;Therapeutic exercise;Patient/family education   PT Goals (Current goals can be found in the Care Plan section) Acute Rehab PT Goals Patient Stated Goal: go home  PT Goal Formulation: With patient Time For Goal Achievement: 06/01/15 Potential to Achieve Goals: Good    Frequency Min 5X/week   Barriers to discharge        Co-evaluation               End of Session Equipment Utilized During Treatment: Gait belt Activity Tolerance: Patient tolerated treatment well;No increased pain Patient left: in chair;with call bell/phone within reach;with family/visitor present Nurse Communication: Mobility status         Time: 1610-9604 PT Time Calculation (min) (ACUTE ONLY): 34 min   Charges:   PT Evaluation $PT Eval Moderate Complexity: 1 Procedure PT Treatments $Gait Training: 8-22 mins   PT G Codes:        Christiane Ha, PT, CSCS Pager [phone] Office [phone]  05/18/2015, 10:18 AM

## 2015-05-18 NOTE — Discharge Summary (Signed)
Patient ID: Fernando Davies MRN: [id number] DOB/AGE: Sep 12, 1995 20 y.o.  Admit date: 05/17/2015 Discharge date: 05/18/2015  Admission Diagnoses:  Principal Problem:   Trimalleolar fracture of right ankle Active Problems:   Trimalleolar fracture of ankle, closed   Discharge Diagnoses:  Same  Past Medical History  Diagnosis Date  . Autism   . Family history of adverse reaction to anesthesia     MGF- difficulty waking up  . Hypertension     not diagnosed  . Anxiety   . ADHD (attention deficit hyperactivity disorder)     Surgeries: Procedure(s): OPEN REDUCTION INTERNAL FIXATION (ORIF) RIGHT ANKLE LATERAL AND MEDIAL MALLEOLUS FRACTURES on 05/17/2015   Consultants:  PT  Discharged Condition: Improved  Hospital Course: Fernando Davies is an 20 y.o. male who was admitted 05/17/2015 for operative treatment ofTrimalleolar fracture of right ankle. Patient has severe unremitting pain that affects sleep, daily activities, and work/hobbies. After pre-op clearance the patient was taken to the operating room on 05/17/2015 and underwent  Procedure(s): OPEN REDUCTION INTERNAL FIXATION (ORIF) RIGHT ANKLE LATERAL AND MEDIAL MALLEOLUS FRACTURES.    Patient was given perioperative antibiotics: Anti-infectives    Start     Dose/Rate Route Frequency Ordered Stop   05/18/15 0600  ceFAZolin (ANCEF) IVPB 2 g/50 mL premix     2 g 100 mL/hr over 30 Minutes Intravenous To ShortStay Surgical 05/17/15 1402 05/17/15 1850   05/18/15 0000  ceFAZolin (ANCEF) IVPB 2 g/50 mL premix     2 g 100 mL/hr over 30 Minutes Intravenous Every 6 hours 05/17/15 2137 05/18/15 1759       Patient was given sequential compression devices, early ambulation, and chemoprophylaxis to prevent DVT.  Patient benefited maximally from hospital stay and there were no complications.    Recent vital signs: Patient Vitals for the past 24 hrs:  BP Temp Temp src Pulse Resp SpO2 Height Weight  05/18/15 0537 (!) 148/78 mmHg 98.1 F  (36.7 C) Oral 88 16 99 % - -  05/17/15 2200 (!) 163/97 mmHg 98.1 F (36.7 C) Oral 89 15 98 % - -  05/17/15 2106 (!) 160/108 mmHg 98 F (36.7 C) - 97 (!) 9 97 % - -  05/17/15 2100 (!) 160/108 mmHg - - 99 12 94 % - -  05/17/15 2052 - - - (!) 104 16 96 % - -  05/17/15 2045 (!) 169/122 mmHg - - (!) 105 17 97 % - -  05/17/15 2030 (!) 165/125 mmHg - - 94 20 96 % - -  05/17/15 2015 (!) 163/118 mmHg - - - (!) 23 96 % - -  05/17/15 1358 (!) 150/94 mmHg 98.3 F (36.8 C) Oral 98 18 98 %  (1.575 m) 74.844 kg (165 lb)     Recent laboratory studies:  Recent Labs  05/17/15 1511  WBC 9.0  HGB 15.5  HCT 42.6  PLT 327  NA 140  K 4.1  CL 106  CO2 23  BUN 9  CREATININE 0.88  GLUCOSE 87  CALCIUM 9.7     Discharge Medications:     Medication List    STOP taking these medications        HYDROcodone-acetaminophen 5-325 MG tablet  Commonly known as:  NORCO/VICODIN      TAKE these medications        aspirin EC 325 MG tablet  Take 1 tablet (325 mg total) by mouth once.     cloNIDine 0.3 MG tablet  Commonly known as:  CATAPRES  Take 1 tablet by mouth at bedtime.     FLUoxetine 20 MG tablet  Commonly known as:  PROZAC  Take 1 tablet by mouth daily.     Melatonin 10 MG Tabs  Take 1 tablet by mouth daily.     methocarbamol 500 MG tablet  Commonly known as:  ROBAXIN  Take 1 tablet (500 mg total) by mouth every 6 (six) hours as needed for muscle spasms.     methylphenidate 54 MG CR tablet  Commonly known as:  CONCERTA  Take 1 tablet by mouth daily.     oxyCODONE-acetaminophen 5-325 MG tablet  Commonly known as:  ROXICET  Take 1-2 tablets by mouth every 4 (four) hours as needed for severe pain.        Diagnostic Studies: Dg Tibia/fibula Right  05/12/2015  CLINICAL DATA:  20 year old male with history of trauma after jumping off of a swing landing heart onto the right ankle. EXAM: RIGHT TIBIA AND FIBULA - 2 VIEW COMPARISON:  No priors. FINDINGS: Three views of the right  tibia and fibula demonstrate an acute fracture/ dislocation of the right ankle joint involving the medial and lateral malleoli. Proximal aspects of the tibia and fibula are otherwise intact. IMPRESSION: Acute bimalleolar fracture dislocation of the right ankle, fully described on associated ankle radiograph 05/12/2015, see that report for full details. Proximal right tibia and fibula are otherwise intact. Electronically Signed   By: Trudie Reed M.D.   On: 05/12/2015 21:00   Dg Ankle 2 Views Right  05/12/2015  CLINICAL DATA:  Status post reduction of ankle fracture. Initial encounter. EXAM: RIGHT ANKLE - 2 VIEW COMPARISON:  Right ankle radiographs performed earlier today at 8:29 p.m. FINDINGS: There is has been interval reduction of the talus, with mild residual lateral displacement, and medial widening of the ankle mortise. There is mild residual lateral displacement of the medial malleolus, and mild posterior displacement of the lateral malleolar and posterior malleolar fragments. Thin osseous fragments projecting anterior to the talar dome may arise from the fibular fracture. Surrounding soft tissue swelling is noted. The soft tissues are difficult to fully assess due to the overlying splint. IMPRESSION: Interval reduction of the talus, with mild residual lateral displacement, and medial widening of the ankle mortise. Mild residual lateral displacement of the medial malleolus, and mild posterior displacement of lateral malleolar and posterior malleolar fragments. Thin osseous fragments projecting anterior to the talar dome may arise from the fibular fracture. Electronically Signed   By: Roanna Raider M.D.   On: 05/12/2015 22:45   Dg Ankle Complete Right  05/17/2015  CLINICAL DATA:  Status post fall with a right ankle fracture 05/12/2015. Intraoperative fixation images. EXAM: RIGHT ANKLE - COMPLETE 3+ VIEW; DG C-ARM 61-120 MIN COMPARISON:  Plain films 05/12/2015. FINDINGS: We are provided with 3  fluoroscopic intraoperative spot views of the right ankle. Images demonstrate 2 screws in the medial malleolus and lateral plate and screws for fixation of medial and lateral malleolar fractures. Position and alignment are anatomic. The tibiotalar joint is located. IMPRESSION: ORIF right ankle fractures without evidence complication. Electronically Signed   By: Drusilla Kanner M.D.   On: 05/17/2015 20:16   Dg Ankle Complete Right  05/12/2015  CLINICAL DATA:  20 year old male with history of trauma after jumping off a swing landing on the right ankle. EXAM: RIGHT ANKLE - COMPLETE 3+ VIEW COMPARISON:  No priors. FINDINGS: Widely displaced lateral malleolus fracture with approximately 15 mm of lateral displacement. Displaced and  angulated fracture of the medial malleoli this, which is approximately 1/2 shaft width laterally displaced and angulated approximately 30 degrees laterally. Total disruption of the ankle mortise, with lateral dislocation of the talus relative to the distal tibia approximately 2 cm. Extensive soft tissue swelling around the ankle joint. IMPRESSION: 1. Severe bimalleolar fracture dislocation of the right ankle, as above. Electronically Signed   By: Trudie Reed M.D.   On: 05/12/2015 20:58   Dg C-arm 1-60 Min  05/17/2015  CLINICAL DATA:  Status post fall with a right ankle fracture 05/12/2015. Intraoperative fixation images. EXAM: RIGHT ANKLE - COMPLETE 3+ VIEW; DG C-ARM 61-120 MIN COMPARISON:  Plain films 05/12/2015. FINDINGS: We are provided with 3 fluoroscopic intraoperative spot views of the right ankle. Images demonstrate 2 screws in the medial malleolus and lateral plate and screws for fixation of medial and lateral malleolar fractures. Position and alignment are anatomic. The tibiotalar joint is located. IMPRESSION: ORIF right ankle fractures without evidence complication. Electronically Signed   By: Drusilla Kanner M.D.   On: 05/17/2015 20:16    Disposition: 01-Home or Self  Care        Follow-up Information    Follow up with Kathryne Hitch, MD. Call in 2 weeks.   Specialty:  Orthopedic Surgery   Contact information:   [address] [place] [address] [phone]        Signed: Richardean Canal 05/18/2015, 8:44 AM

## 2015-05-18 NOTE — Progress Notes (Signed)
[name] discharged home per MD order. Discharge instructions reviewed and discussed with patient. All questions and concerns answered. Copy of instructions and scripts given to patient. IV removed.  Patient escorted to car by staff in a wheelchair. No distress noted upon discharge.   Dennard Nip R 05/18/2015 4:14 PM

## 2015-05-18 NOTE — Op Note (Signed)
NAME:  Fernando Davies, Fernando Davies NO.:  [id number]  MEDICAL RECORD NO.:  [id number]  LOCATION:  [facility]                         FACILITY:  [facility]  PHYSICIAN:  Vanita Panda. Magnus Ivan, M.D.DATE OF BIRTH:  Dec 06, 1995  DATE OF PROCEDURE:  05/17/2015 DATE OF DISCHARGE:                              OPERATIVE REPORT   PREOPERATIVE DIAGNOSIS:  Right trimalleolar ankle fracture dislocation.  POSTOPERATIVE DIAGNOSIS:  Right trimalleolar ankle fracture dislocation.  PROCEDURE:  Open reduction and internal fixation of right ankle lateral and medial malleolus only.  IMPLANTS:  Stryker VariAx ankle plating system with a 4-hole distal lateral fibular plate with proximal and distal interlocking screws and two 4.0 mm cannulated medial screws.  SURGEON:  Vanita Panda. Magnus Ivan, M.D.  ASSISTANT:  Richardean Canal, PA-C.  ANESTHESIA: 1. General. 2. Local with 0.25% plain Marcaine.  TOURNIQUET TIME:  Under 2 hours.  COMPLICATIONS:  None.  INDICATIONS:  Fernando Davies is a 20 year old, highly functioning autistic male, who injured his right ankle last Thursday jumping out of a swing landing on the ankle awkwardly.  He was seen at [facility] Emergency Room and found to have a trimalleolar ankle fracture dislocation.  This was reduced appropriately by the ER staff, and he was placed in a well- padded splint, and I saw him in the office yesterday with his mother. Due to the unstable nature of this fracture, open reduction and internal fixation has been recommended, and his mother understands the reason behind proceeding with surgery.  An informed consent has been obtained.  PROCEDURE DESCRIPTION:  After informed consent was obtained, appropriate right ankle was marked.  He was brought to the operating room, placed supine on the operating table.  General anesthesia was then obtained.  A nonsterile tourniquet was placed around his upper right thigh, and his right leg was prepped and draped from the  knee down to the toes with DuraPrep and sterile drapes.  Time-out was called to identify the correct patient, correct right ankle.  We then used Esmarch to wrap out the ankle and tourniquet inflated to 300 mm of pressure.  We then made an incision on the lateral aspect of his ankle and carried this down to the lateral malleolus which was found to be highly comminuted.  We could not place a lag screw.  We did at least use reduction forceps to reduce the fracture as best as possible.  We then chose the Stryker 4-hole distal fibular plate, secured this with bicortical screws proximally and locking screws distally to secure the fracture.  We then went to the medial side and made a medial incision directly over the displaced medial malleolus, we were able to remove fracture debris from this and irrigated the ankle joint.  We then held this in a temporary reduced position with 2 temporary K-wires. We took measurements of these and then drilled two 4.0 mm partially threaded cannulated screws size 50 mm in length.  Once we were done with this, the K-wires were removed.  We then stressed the ankle mortise under direct fluoroscopy and it was stable.  We then irrigated both wounds with normal saline solution and closed the deep tissue with 0 Vicryl over the hardware, 2-0  Vicryl in subcutaneous tissue, interrupted 2-0 nylon in the skin.  We infiltrated both incisions with 0.25% plain Marcaine and then placed Xeroform, well- padded sterile dressing and a plaster splint.  He was then awakened, extubated, and taken to the recovery room in stable condition.  All final counts were correct.  There were no complications noted.  Of note, Richardean Canal, PA-C, assisted the entire case.  His assistance was crucial for facilitating all aspects of this case.     Vanita Panda. Magnus Ivan, M.D.     CYB/MEDQ  D:  05/17/2015  T:  05/17/2015  Job:  [id number]

## 2015-05-18 NOTE — Discharge Instructions (Signed)
Non weight bearing right lower extremity.  Keep splint clean dry and intact. Elevate leg above heart and wiggle toes often.

## 2015-05-24 ENCOUNTER — Encounter (INDEPENDENT_AMBULATORY_CARE_PROVIDER_SITE_OTHER): Payer: Managed Care, Other (non HMO) | Admitting: Pediatrics

## 2015-05-24 DIAGNOSIS — F902 Attention-deficit hyperactivity disorder, combined type: Secondary | ICD-10-CM

## 2015-05-24 DIAGNOSIS — F840 Autistic disorder: Secondary | ICD-10-CM | POA: Diagnosis not present

## 2015-05-24 NOTE — Progress Notes (Signed)
   05/18/15 1021  PT G-Codes **NOT FOR INPATIENT CLASS**  Functional Assessment Tool Used clinical judgment  Functional Limitation Mobility: Walking and moving around  Mobility: Walking and Moving Around Current Status [phone]) CI  Mobility: Walking and Moving Around Goal Status ([id number]) CI  Christiane Ha, PT, CSCS Pager [phone] Office [phone]

## 2015-07-11 ENCOUNTER — Other Ambulatory Visit: Payer: Self-pay | Admitting: Pediatrics

## 2015-07-11 MED ORDER — METHYLPHENIDATE HCL ER (OSM) 54 MG PO TBCR: 54.0000 mg | EXTENDED_RELEASE_TABLET | Freq: Every day | ORAL | Status: DC

## 2015-07-11 NOTE — Telephone Encounter (Signed)
Mom called for refill for Concerta to be mailed to home address.  Patient last seen 05/24/15, next appointment 08/15/15.

## 2015-07-11 NOTE — Telephone Encounter (Signed)
Printed the Rx for Concerta 54 and placed in the mail bag for next outgoing mail

## 2015-08-03 MED ORDER — FLUOXETINE HCL 20 MG PO TABS: 20.0000 mg | ORAL_TABLET | Freq: Every day | ORAL | Status: DC

## 2015-08-03 NOTE — Telephone Encounter (Signed)
RX for Prozac 20 mg e-scribed and sent to [facility] in [place], [place].

## 2015-08-09 ENCOUNTER — Telehealth: Payer: Self-pay | Admitting: Pediatrics

## 2015-08-09 MED ORDER — METHYLPHENIDATE HCL ER (OSM) 54 MG PO TBCR: 54.0000 mg | EXTENDED_RELEASE_TABLET | Freq: Every day | ORAL | Status: DC

## 2015-08-09 NOTE — Telephone Encounter (Signed)
Needs refill of concerta 54 mg, has appt next week Printed refill and placed up front for mother to pick up

## 2015-08-15 ENCOUNTER — Ambulatory Visit (INDEPENDENT_AMBULATORY_CARE_PROVIDER_SITE_OTHER): Payer: Managed Care, Other (non HMO) | Admitting: Pediatrics

## 2015-08-15 ENCOUNTER — Encounter: Payer: Self-pay | Admitting: Pediatrics

## 2015-08-15 VITALS — Ht 62.0 in | Wt 168.4 lb

## 2015-08-15 DIAGNOSIS — F840 Autistic disorder: Secondary | ICD-10-CM

## 2015-08-15 DIAGNOSIS — F902 Attention-deficit hyperactivity disorder, combined type: Secondary | ICD-10-CM

## 2015-08-15 DIAGNOSIS — F411 Generalized anxiety disorder: Secondary | ICD-10-CM | POA: Diagnosis not present

## 2015-08-15 MED ORDER — CLONIDINE HCL 0.3 MG PO TABS: 0.3000 mg | ORAL_TABLET | Freq: Every day | ORAL | Status: DC

## 2015-08-15 MED ORDER — FLUOXETINE HCL 20 MG PO TABS: 20.0000 mg | ORAL_TABLET | Freq: Every day | ORAL | Status: DC

## 2015-08-15 MED ORDER — METHYLPHENIDATE HCL ER (OSM) 54 MG PO TBCR: 54.0000 mg | EXTENDED_RELEASE_TABLET | Freq: Every day | ORAL | Status: DC

## 2015-08-15 NOTE — Progress Notes (Signed)
[facility] [facility] [place] [facility] [address], [address] [place] [id number] Dept: [phone] Dept Fax: [phone] Loc: [phone] Loc Fax: [phone]  Medical Follow-up  Patient ID: Fernando Davies, male  DOB: 09/27/1995, 20 y.o.  MRN: [id number]  Date of Evaluation: 08/15/15  PCP: Carmin RichmondLARK,WILLIAM D, MD  Accompanied by: Mother Patient Lives with: mother  HISTORY/CURRENT STATUS:  HPI routine visit, medication check  EDUCATION: School: [employer] Year/Grade: 1st year Homework Time: n/a Performance/Grades: average, thinking about art and science course in the summer Services: Other: none college Activities/Exercise: rarely  MEDICAL HISTORY: Appetite: good MVI/Other: 0 Fruits/Vegs:1-2 servings/day Calcium: 0 Iron:0  Sleep: Bedtime: 11 Awakens: 6-7 Sleep Concerns: Initiation/Maintenance/Other: sleeps well, occasional difficulty initiating  Individual Medical History/Review of System Changes? Yes had broken ankle-has been released from ortho Review of Systems  Constitutional: Negative.   HENT: Negative.   Eyes: Negative.   Respiratory: Negative.   Cardiovascular: Negative.   Gastrointestinal: Negative.   Genitourinary: Negative.   Musculoskeletal: Negative.   Skin: Negative.   Neurological: Negative.   Endo/Heme/Allergies: Negative.   Psychiatric/Behavioral: Negative.     Allergies: Review of patient's allergies indicates no known allergies.  Current Medications:  Current outpatient prescriptions:  .  aspirin EC 325 MG tablet, Take 1 tablet (325 mg total) by mouth once., Disp: 21 tablet, Rfl: 0 .  cloNIDine (CATAPRES) 0.3 MG tablet, Take 1 tablet by mouth at bedtime. , Disp: , Rfl:  .  FLUoxetine (PROZAC) 20 MG tablet, Take 1 tablet (20 mg total) by mouth daily., Disp: 30 tablet, Rfl: 2 .  Melatonin 10 MG TABS, Take 1 tablet by mouth daily., Disp: , Rfl:  .   methocarbamol (ROBAXIN) 500 MG tablet, Take 1 tablet (500 mg total) by mouth every 6 (six) hours as needed for muscle spasms., Disp: 40 tablet, Rfl: 1 .  methylphenidate 54 MG PO CR tablet, Take 1 tablet (54 mg total) by mouth daily with breakfast., Disp: 30 tablet, Rfl: 0 .  oxyCODONE-acetaminophen (ROXICET) 5-325 MG tablet, Take 1-2 tablets by mouth every 4 (four) hours as needed for severe pain., Disp: 60 tablet, Rfl: 0 Medication Side Effects: None  Family Medical/Social History Changes?: No  MENTAL HEALTH: Mental Health Issues: Anxiety and poor social skills  PHYSICAL EXAM: Vitals: There were no vitals filed for this visit., No unique date with height and weight on file.  General Exam: Physical Exam  Constitutional: He is oriented to person, place, and time. He appears well-developed and well-nourished. No distress.  HENT:  Head: Normocephalic and atraumatic.  Right Ear: External ear normal.  Left Ear: External ear normal.  Nose: Nose normal.  Mouth/Throat: Oropharynx is clear and moist. No oropharyngeal exudate.  Eyes: Conjunctivae and EOM are normal. Pupils are equal, round, and reactive to light. Right eye exhibits no discharge. Left eye exhibits no discharge. No scleral icterus.  Neck: Normal range of motion. Neck supple. No JVD present. No tracheal deviation present. No thyromegaly present.  Cardiovascular: Normal rate, regular rhythm, normal heart sounds and intact distal pulses.  Exam reveals no gallop and no friction rub.   No murmur heard. Pulmonary/Chest: Effort normal and breath sounds normal. No stridor. No respiratory distress. He has no wheezes. He has no rales. He exhibits no tenderness.  Abdominal: Soft. Bowel sounds are normal. He exhibits no distension and no mass. There is no tenderness. There is no rebound and no guarding. No hernia.  Genitourinary:  deferred  Musculoskeletal: Normal range of motion. He exhibits no edema or tenderness.  Lymphadenopathy:    He  has no cervical adenopathy.  Neurological: He is alert and oriented to person, place, and time. He has normal reflexes. He displays normal reflexes. No cranial nerve deficit. He exhibits normal muscle tone. Coordination normal.  Skin: Skin is warm and dry. No rash noted. He is not diaphoretic. No erythema. No pallor.  Psychiatric:  autistic  Vitals reviewed.   Neurological: oriented to time, place, and person Cranial Nerves: normal  Neuromuscular:  Motor Mass: normal Tone: normal Strength: normal DTRs: 2+ and symmetric hamstring reflex (L-4 to S-2) right and left 3/4, ue-2/4 Overflow: mild Reflexes: no tremors noted, finger to nose without dysmetria bilaterally, performs thumb to finger exercise without difficulty, gait was normal and difficulty with tandem Sensory Exam: Vibratory: not done  Fine Touch: normal  Testing/Developmental Screens: CGI:7, AS/RS 11/12   DIAGNOSES: No diagnosis found.  RECOMMENDATIONS:  Patient Instructions  Continue concerta 54 mg every morning prozac 20 mg every morning Clonidine 0.3 mg at bedtime with melatonin Discussed BMI-has lost 10 lbs since January     NEXT APPOINTMENT: No Follow-up on file.   Nicholos Johns, NP Counseling Time: 30 Total Contact Time: 50 More than 50% of the visit was in counseling

## 2015-08-15 NOTE — Patient Instructions (Signed)
Continue concerta 54 mg every morning prozac 20 mg every morning Clonidine 0.3 mg at bedtime with melatonin Discussed BMI-has lost 10 lbs since January

## 2015-10-04 ENCOUNTER — Other Ambulatory Visit: Payer: Self-pay | Admitting: Pediatrics

## 2015-10-04 MED ORDER — METHYLPHENIDATE HCL ER (OSM) 54 MG PO TBCR: 54.0000 mg | EXTENDED_RELEASE_TABLET | Freq: Every day | ORAL | Status: DC

## 2015-10-04 NOTE — Telephone Encounter (Signed)
Printed Rx and mailed  

## 2015-10-04 NOTE — Telephone Encounter (Signed)
Mom called for refill for Concerta.  Patient last seen 08/15/15, next appointment 11/14/15.  Please mail to home address. °

## 2015-11-02 ENCOUNTER — Telehealth: Payer: Self-pay | Admitting: Pediatrics

## 2015-11-02 MED ORDER — METHYLPHENIDATE HCL ER (OSM) 54 MG PO TBCR: 54.0000 mg | EXTENDED_RELEASE_TABLET | Freq: Every day | ORAL | Status: DC

## 2015-11-02 NOTE — Telephone Encounter (Signed)
Medication refill: Concerta 54 mg (generic) #30 with no refills printed, signed and given to Lance MussDeborah Scharf to mail to mother.

## 2015-11-02 NOTE — Telephone Encounter (Signed)
Mom called for refill for Concerta.  Patient last seen 08/15/15, next appointment 11/14/15.  Please mail to home address.

## 2015-11-14 ENCOUNTER — Ambulatory Visit (INDEPENDENT_AMBULATORY_CARE_PROVIDER_SITE_OTHER): Payer: Managed Care, Other (non HMO) | Admitting: Pediatrics

## 2015-11-14 ENCOUNTER — Encounter: Payer: Self-pay | Admitting: Pediatrics

## 2015-11-14 VITALS — BP 130/80 | Ht 62.0 in | Wt 179.4 lb

## 2015-11-14 DIAGNOSIS — F411 Generalized anxiety disorder: Secondary | ICD-10-CM

## 2015-11-14 DIAGNOSIS — F902 Attention-deficit hyperactivity disorder, combined type: Secondary | ICD-10-CM

## 2015-11-14 DIAGNOSIS — F840 Autistic disorder: Secondary | ICD-10-CM

## 2015-11-14 MED ORDER — FLUOXETINE HCL 20 MG PO TABS: 20.0000 mg | ORAL_TABLET | Freq: Every day | ORAL | 2 refills | Status: DC

## 2015-11-14 MED ORDER — CLONIDINE HCL 0.3 MG PO TABS: 0.3000 mg | ORAL_TABLET | Freq: Every day | ORAL | 2 refills | Status: DC

## 2015-11-14 MED ORDER — METHYLPHENIDATE HCL ER (OSM) 54 MG PO TBCR: 54.0000 mg | EXTENDED_RELEASE_TABLET | Freq: Every day | ORAL | 0 refills | Status: DC

## 2015-11-14 NOTE — Progress Notes (Signed)
[facility] [place] [facility] [address] [place] [place] [address] Dept: [phone] Dept Fax: [phone] Loc: [phone] Loc Fax: [phone]  Medical Follow-up  Patient ID: Fernando Davies, male  DOB: Sep 20, 1995, 20 y.o.  MRN: [id number]  Date of Evaluation: 11/14/15  PCP: Carmin Richmond, MD  Accompanied by: Mother Patient Lives with: mother  HISTORY/CURRENT STATUS:  HPI routine visit, medication check Increased anxiety at bedtime, tired in am Has been off schedule  EDUCATION: School: [employer] Year/Grade: 2nd yr Homework Time: summer vacation Performance/Grades: average Services: Other: 504 Activities/Exercise: walks in neighborhood Going to join the [employer]  MEDICAL HISTORY: Appetite: good MVI/Other: none Fruits/Vegs:fair Calcium: yogurt and cheese Iron:0  Sleep: Bedtime: 11 Awakens: 7-8 Sleep Concerns: Initiation/Maintenance/Other: sleeps well  Individual Medical History/Review of System Changes? No Review of Systems  Constitutional: Negative.  Negative for chills, diaphoresis, fever, malaise/fatigue and weight loss.  HENT: Negative.  Negative for congestion, ear discharge, ear pain, hearing loss, nosebleeds, sore throat and tinnitus.   Eyes: Negative.  Negative for blurred vision, double vision, photophobia, pain, discharge and redness.  Respiratory: Negative.  Negative for cough, hemoptysis, sputum production, shortness of breath, wheezing and stridor.   Cardiovascular: Negative.  Negative for chest pain, palpitations, orthopnea, claudication, leg swelling and PND.  Gastrointestinal: Negative.  Negative for abdominal pain, blood in stool, constipation, diarrhea, melena, nausea and vomiting.  Genitourinary: Negative.  Negative for dysuria, flank pain, frequency, hematuria and urgency.  Musculoskeletal: Negative.  Negative for back pain, falls, joint  pain, myalgias and neck pain.  Skin: Negative.  Negative for itching and rash.  Neurological: Negative.  Negative for dizziness, tingling, tremors, sensory change, speech change, focal weakness, seizures, loss of consciousness, weakness and headaches.  Endo/Heme/Allergies: Negative.  Negative for environmental allergies and polydipsia. Does not bruise/bleed easily.  Psychiatric/Behavioral: Negative.  Negative for depression, hallucinations, memory loss, substance abuse and suicidal ideas. The patient is not nervous/anxious and does not have insomnia.    Allergies: Review of patient's allergies indicates no known allergies.  Current Medications:  Current Outpatient Prescriptions:  .  aspirin EC 325 MG tablet, Take 1 tablet (325 mg total) by mouth once., Disp: 21 tablet, Rfl: 0 .  cloNIDine (CATAPRES) 0.3 MG tablet, Take 1 tablet (0.3 mg total) by mouth at bedtime., Disp: 30 tablet, Rfl: 2 .  FLUoxetine (PROZAC) 20 MG tablet, Take 1 tablet (20 mg total) by mouth daily., Disp: 30 tablet, Rfl: 2 .  Melatonin 10 MG TABS, Take 1 tablet by mouth daily., Disp: , Rfl:  .  methylphenidate 54 MG PO CR tablet, Take 1 tablet (54 mg total) by mouth daily with breakfast., Disp: 30 tablet, Rfl: 0 Medication Side Effects: None  Family Medical/Social History Changes?: Yes living with mother's boyfriend, Loraine Leriche, gets along with [place] very well  MENTAL HEALTH: Mental Health Issues: poor social skills  PHYSICAL EXAM: Vitals:  Today's Vitals   11/14/15 1701  BP: 130/80  Weight: 179 lb 6.4 oz (81.4 kg)  Height: 5\' 2"  (1.575 m)  PainSc: 0-No pain  , Facility age limit for growth percentiles is 20 years.  General Exam: Physical Exam  Constitutional: He is oriented to person, place, and time. He appears well-developed and well-nourished. No distress.  HENT:  Head: Normocephalic and atraumatic.  Right Ear: External ear normal.  Left Ear: External ear normal.  Nose: Nose normal.  Mouth/Throat: Oropharynx  is clear and moist. No oropharyngeal exudate.  Eyes: Conjunctivae and EOM are normal. Pupils are equal, round, and reactive to light. Right eye exhibits no discharge. Left eye exhibits no discharge. No scleral icterus.  Neck: Normal range of motion. Neck supple. No JVD present. No tracheal deviation present. No thyromegaly present.  Cardiovascular: Normal rate, regular rhythm, normal heart sounds and intact distal pulses.  Exam reveals no gallop and no friction rub.   No murmur heard. Pulmonary/Chest: Effort normal and breath sounds normal. No stridor. No respiratory distress. He has no wheezes. He has no rales. He exhibits no tenderness.  Abdominal: Soft. Bowel sounds are normal. He exhibits no distension and no mass. There is no tenderness. There is no rebound and no guarding. No hernia.  Genitourinary:  Genitourinary Comments: deferred  Musculoskeletal: Normal range of motion. He exhibits no edema, tenderness or deformity.  Lymphadenopathy:    He has no cervical adenopathy.  Neurological: He is alert and oriented to person, place, and time. He has normal reflexes. He displays normal reflexes. No cranial nerve deficit. He exhibits normal muscle tone. Coordination normal.  Skin: Skin is warm and dry. No rash noted. He is not diaphoretic. No erythema. No pallor.  Psychiatric:  Poor conversational skills Easily irritated Poor judgement and thought content Fairly good behavior  Vitals reviewed.   Neurological: oriented to time, place, and person Cranial Nerves: normal  Neuromuscular:  Motor Mass: normal Tone: normal Strength: normal DTRs: 2+ and symmetric Overflow: mild Reflexes: no tremors noted, finger to nose without dysmetria bilaterally, performs thumb to finger exercise without difficulty, gait was normal and difficulty with tandem Sensory Exam: Vibratory: not done  Fine Touch: normal  Testing/Developmental Screens:  AS/RS 11/14  DIAGNOSES:    ICD-9-CM ICD-10-CM   1. ADHD  (attention deficit hyperactivity disorder), combined type 314.01 F90.2   2. Autistic disorder 299.00 F84.0   3. Generalized anxiety disorder 300.02 F41.1     RECOMMENDATIONS:  Patient Instructions  Continue concerta 54 mg every morning Clonidine 0.3 mg at bedtime prozac 20 mg daily  Discussed weight-BMI 32, increase activity and decrease calories Discussed future abilities-will probably never be able to live alone Discussed need for SSI   NEXT APPOINTMENT: Return in about 3 months (around 02/14/2016), or if symptoms worsen or fail to improve.   Nicholos Johns, NP Counseling Time: 30 Total Contact Time: 50 More than 50% of the visit involved counseling, discussing the diagnosis and management of symptoms with the patient and family

## 2015-11-14 NOTE — Patient Instructions (Signed)
Continue concerta 54 mg every morning Clonidine 0.3 mg at bedtime prozac 20 mg daily  Discussed weight-BMI 32, increase activity and decrease calories Discussed future abilities-will probably never be able to live alone Discussed need for SSI

## 2015-12-08 ENCOUNTER — Other Ambulatory Visit: Payer: Self-pay | Admitting: Pediatrics

## 2015-12-08 NOTE — Telephone Encounter (Signed)
Received fax requesting refill for Clonidine 0.3 mg.  Patient last seen 11/14/15, next appointment 02/13/16.

## 2015-12-08 NOTE — Telephone Encounter (Signed)
Refill request denied. Recent refill escribed to [id number] in [place] on 11/14/15 with 2 additional refills. Faxed note to requesting pharmacy [facility] [address] [place].

## 2016-01-02 ENCOUNTER — Other Ambulatory Visit: Payer: Self-pay | Admitting: Pediatrics

## 2016-01-02 MED ORDER — METHYLPHENIDATE HCL ER (OSM) 54 MG PO TBCR: 54.0000 mg | EXTENDED_RELEASE_TABLET | Freq: Every day | ORAL | 0 refills | Status: DC

## 2016-01-02 NOTE — Telephone Encounter (Signed)
Mom called for refill for Concerta.  Patient last seen 11/14/15, next appointment 02/13/16.  Please mail to home address.

## 2016-01-02 NOTE — Telephone Encounter (Signed)
Concerta 54 mg #30 with no refills printed, signed, and left for pickup.

## 2016-01-31 ENCOUNTER — Other Ambulatory Visit: Payer: Self-pay | Admitting: Pediatrics

## 2016-01-31 MED ORDER — METHYLPHENIDATE HCL ER (OSM) 54 MG PO TBCR: 54.0000 mg | EXTENDED_RELEASE_TABLET | Freq: Every day | ORAL | 0 refills | Status: DC

## 2016-01-31 NOTE — Telephone Encounter (Signed)
Printed Rx and mailed  

## 2016-01-31 NOTE — Telephone Encounter (Signed)
Mom called for refill for Concerta.  Patient last seen 11/14/15, next appointment 02/13/16.  Please mail to home address.

## 2016-02-13 ENCOUNTER — Encounter: Payer: Self-pay | Admitting: Pediatrics

## 2016-02-13 ENCOUNTER — Ambulatory Visit (INDEPENDENT_AMBULATORY_CARE_PROVIDER_SITE_OTHER): Payer: Managed Care, Other (non HMO) | Admitting: Pediatrics

## 2016-02-13 VITALS — BP 120/80 | Ht 62.0 in | Wt 186.6 lb

## 2016-02-13 DIAGNOSIS — F902 Attention-deficit hyperactivity disorder, combined type: Secondary | ICD-10-CM

## 2016-02-13 DIAGNOSIS — F411 Generalized anxiety disorder: Secondary | ICD-10-CM | POA: Diagnosis not present

## 2016-02-13 DIAGNOSIS — F840 Autistic disorder: Secondary | ICD-10-CM | POA: Diagnosis not present

## 2016-02-13 MED ORDER — FLUOXETINE HCL 20 MG PO TABS: 20.0000 mg | ORAL_TABLET | Freq: Every day | ORAL | 2 refills | Status: DC

## 2016-02-13 MED ORDER — METHYLPHENIDATE HCL ER (OSM) 54 MG PO TBCR: 54.0000 mg | EXTENDED_RELEASE_TABLET | Freq: Every day | ORAL | 0 refills | Status: DC

## 2016-02-13 MED ORDER — CLONIDINE HCL 0.3 MG PO TABS: 0.3000 mg | ORAL_TABLET | Freq: Every day | ORAL | 2 refills | Status: DC

## 2016-02-13 NOTE — Progress Notes (Signed)
[facility] [address], [address] [place] [address] Dept: [phone] Dept Fax: [phone] Loc: [phone] Loc Fax: [phone]  Medical Follow-up  Patient ID: Fernando KaSamuel J Davies, male  DOB: 03/10/1996, 20 y.o.  MRN: [id number]  Date of Evaluation: 02/13/16  PCP: Carmin RichmondLARK,WILLIAM D, MD  Accompanied by: Mother Patient Lives with: mother  HISTORY/CURRENT STATUS:  HPI  Routine visit, medication check Doing well, medication dosing good Fearful of doing more exercise do to prior ankle injury Nervous about actually going to class next semester- all classes now are on line  EDUCATION: School: [employer] Year/Grade: 2nd yr Homework Time: n/a all courses on line Performance/Grades: average Services: IEP/504 Plan-504 Activities/Exercise: some walking, likes swinging  MEDICAL HISTORY: Appetite: good MVI/Other: none Fruits/Vegs:some Calcium: cheese and yogurt Iron:some meats-chicken, shrimp  Sleep: Bedtime: 11 Awakens: 7 Sleep Concerns: Initiation/Maintenance/Other: sleeps well   Individual Medical History/Review of System Changes? No Review of Systems  Constitutional: Negative.  Negative for chills, diaphoresis, fever, malaise/fatigue and weight loss.  HENT: Negative.  Negative for congestion, ear discharge, ear pain, hearing loss, nosebleeds, sore throat and tinnitus.   Eyes: Negative.  Negative for blurred vision, double vision, photophobia, pain, discharge and redness.  Respiratory: Negative.  Negative for cough, hemoptysis, sputum production, shortness of breath, wheezing and stridor.   Cardiovascular: Negative.  Negative for chest pain, palpitations, orthopnea, claudication, leg swelling and PND.  Gastrointestinal: Negative.  Negative for abdominal pain, blood in stool, constipation, diarrhea, heartburn, melena, nausea and vomiting.  Genitourinary:  Negative.  Negative for dysuria, flank pain, frequency, hematuria and urgency.  Musculoskeletal: Negative.  Negative for back pain, falls, joint pain, myalgias and neck pain.  Skin: Negative.  Negative for itching and rash.  Neurological: Negative.  Negative for dizziness, tingling, tremors, sensory change, speech change, focal weakness, seizures, loss of consciousness, weakness and headaches.  Endo/Heme/Allergies: Negative.  Negative for environmental allergies and polydipsia. Does not bruise/bleed easily.  Psychiatric/Behavioral: Negative.  Negative for depression, hallucinations, memory loss, substance abuse and suicidal ideas. The patient is not nervous/anxious and does not have insomnia.    Allergies: Review of patient's allergies indicates no known allergies.  Current Medications:  Current Outpatient Prescriptions:  .  aspirin EC 325 MG tablet, Take 1 tablet (325 mg total) by mouth once., Disp: 21 tablet, Rfl: 0 .  cloNIDine (CATAPRES) 0.3 MG tablet, Take 1 tablet (0.3 mg total) by mouth at bedtime., Disp: 30 tablet, Rfl: 2 .  FLUoxetine (PROZAC) 20 MG tablet, Take 1 tablet (20 mg total) by mouth daily., Disp: 30 tablet, Rfl: 2 .  Melatonin 10 MG TABS, Take 1 tablet by mouth daily., Disp: , Rfl:  .  methylphenidate 54 MG PO CR tablet, Take 1 tablet (54 mg total) by mouth daily with breakfast., Disp: 30 tablet, Rfl: 0 Medication Side Effects: None  Family Medical/Social History Changes?: No  MENTAL HEALTH: Mental Health Issues: poor social skills  PHYSICAL EXAM: Vitals:  Today's Vitals   02/13/16 1616  BP: 120/80  Weight: 186 lb 9.6 oz (84.6 kg)  Height: 5\' 2"  (1.575 m)  PainSc: 0-No pain  , Facility age limit for growth percentiles is 20 years.  General Exam: Physical Exam  Constitutional: He is oriented to person, place, and time. He appears well-developed and well-nourished. No distress.  HENT:  Head: Normocephalic and atraumatic.  Right Ear: External ear normal.  Left  Ear: External ear normal.  Nose: Nose normal.  Mouth/Throat: Oropharynx is clear and moist. No oropharyngeal exudate.  Eyes: Conjunctivae and EOM are normal. Pupils are equal, round, and reactive to light. Right eye exhibits no discharge. Left eye exhibits no discharge. No scleral icterus.  Neck: Normal range of motion. Neck supple. No JVD present. No tracheal deviation present. No thyromegaly present.  Cardiovascular: Normal rate, regular rhythm, normal heart sounds and intact distal pulses.  Exam reveals no gallop and no friction rub.   No murmur heard. Pulmonary/Chest: Effort normal and breath sounds normal. No stridor. No respiratory distress. He has no wheezes. He has no rales. He exhibits no tenderness.  Abdominal: Soft. Bowel sounds are normal. He exhibits no distension and no mass. There is no tenderness. There is no rebound and no guarding. No hernia.  Musculoskeletal: Normal range of motion. He exhibits no edema, tenderness or deformity.  Lymphadenopathy:    He has no cervical adenopathy.  Neurological: He is alert and oriented to person, place, and time. He has normal reflexes. He displays normal reflexes. No cranial nerve deficit. He exhibits normal muscle tone. Coordination normal.  Skin: Skin is warm and dry. No rash noted. He is not diaphoretic. No erythema. No pallor.  Psychiatric: He has a normal mood and affect. His behavior is normal.  Vitals reviewed.   Neurological: oriented to time, place, and person Cranial Nerves: normal  Neuromuscular:  Motor Mass: normal Tone: normal Strength: normal DTRs: normal 2+ and symmetric Overflow: mild Reflexes: no tremors noted, finger to nose without dysmetria bilaterally, performs thumb to finger exercise without difficulty, gait was normal and difficulty with tandem Sensory Exam: Vibratory: not done  Fine Touch: normal  Testing/Developmental Screens:  AS/RS 11/11  DIAGNOSES:    ICD-9-CM ICD-10-CM   1. ADHD (attention deficit  hyperactivity disorder), combined type 314.01 F90.2   2. Autistic disorder 299.00 F84.0   3. Generalized anxiety disorder 300.02 F41.1     RECOMMENDATIONS:  Patient Instructions  Continue concerta 54 mg every morning prozac 20 mg daily Clonidine 0.3 mg at bedtime discussed weight/exercise Discussed school progress and return to the classroom  NEXT APPOINTMENT: Return in about 3 months (around 05/15/2016), or if symptoms worsen or fail to improve, for Medical follow up.   Nicholos Johns, NP Counseling Time: 30 Total Contact Time: 50 More than 50% of the visit involved counseling, discussing the diagnosis and management of symptoms with the patient and family

## 2016-02-13 NOTE — Patient Instructions (Signed)
Continue concerta 54 mg every morning prozac 20 mg daily Clonidine 0.3 mg at bedtime

## 2016-03-30 ENCOUNTER — Other Ambulatory Visit: Payer: Self-pay | Admitting: Pediatrics

## 2016-03-30 NOTE — Telephone Encounter (Signed)
Mom called for refill for Concerta.  Patient last seen 02/13/16, next appointment 05/14/16.  Please mail to home address.

## 2016-04-02 MED ORDER — METHYLPHENIDATE HCL ER (OSM) 54 MG PO TBCR: 54.0000 mg | EXTENDED_RELEASE_TABLET | Freq: Every day | ORAL | 0 refills | Status: DC

## 2016-04-02 NOTE — Telephone Encounter (Signed)
Printed Rx and mailed-Concerta 54 mg

## 2016-05-04 ENCOUNTER — Other Ambulatory Visit: Payer: Self-pay | Admitting: Pediatrics

## 2016-05-04 MED ORDER — METHYLPHENIDATE HCL ER (OSM) 54 MG PO TBCR: 54.0000 mg | EXTENDED_RELEASE_TABLET | Freq: Every day | ORAL | 0 refills | Status: DC

## 2016-05-04 NOTE — Telephone Encounter (Signed)
Mom left a msg last night requesting a new RX for this pt for Concerta. She wants it mailed to home as usual. Pt has an apt scheduled for 05/14/16 with JR.  jd

## 2016-05-04 NOTE — Telephone Encounter (Signed)
Printed Rx and mailed-Concerta 54 mg daily.  

## 2016-05-14 ENCOUNTER — Ambulatory Visit (INDEPENDENT_AMBULATORY_CARE_PROVIDER_SITE_OTHER): Payer: Managed Care, Other (non HMO) | Admitting: Pediatrics

## 2016-05-14 ENCOUNTER — Encounter: Payer: Self-pay | Admitting: Pediatrics

## 2016-05-14 ENCOUNTER — Institutional Professional Consult (permissible substitution): Payer: Managed Care, Other (non HMO) | Admitting: Pediatrics

## 2016-05-14 VITALS — BP 130/80 | Ht 62.0 in | Wt 186.8 lb

## 2016-05-14 DIAGNOSIS — F411 Generalized anxiety disorder: Secondary | ICD-10-CM

## 2016-05-14 DIAGNOSIS — F840 Autistic disorder: Secondary | ICD-10-CM | POA: Diagnosis not present

## 2016-05-14 DIAGNOSIS — F902 Attention-deficit hyperactivity disorder, combined type: Secondary | ICD-10-CM | POA: Diagnosis not present

## 2016-05-14 MED ORDER — METHYLPHENIDATE HCL ER (OSM) 54 MG PO TBCR: 54.0000 mg | EXTENDED_RELEASE_TABLET | Freq: Every day | ORAL | 0 refills | Status: DC

## 2016-05-14 MED ORDER — FLUOXETINE HCL 20 MG PO TABS: 20.0000 mg | ORAL_TABLET | Freq: Every day | ORAL | 2 refills | Status: DC

## 2016-05-14 MED ORDER — CLONIDINE HCL 0.3 MG PO TABS: 0.3000 mg | ORAL_TABLET | Freq: Every day | ORAL | 2 refills | Status: DC

## 2016-05-14 NOTE — Progress Notes (Addendum)
[facility] [address] [place] [place] [address] Dept: [phone] Dept Fax: [phone] Loc: [phone] Loc Fax: [phone]  Medical Follow-up  Patient ID: Fernando Davies, male  DOB: [birth year], 20 y.o.  MRN: [id number]  Date of Evaluation:   [facility] [address], [place] [place] [address] Dept: [phone] Dept Fax: [phone] Loc: [phone] Loc Fax: [phone]  Medical Follow-up  Patient ID: Fernando Davies, male  DOB: [birth year], 20 y.o.  MRN: [id number]  Date of Evaluation: 05/15/15  PCP: Carmin Richmond, MD  Accompanied by: Mother Patient Lives with: mother  HISTORY/CURRENT STATUS:  HPI  Routine visit, medication check Doing well, medication dosing good Fearful of doing more exercise do to prior ankle injury Has 2 classes on line, has lab once weekly-has to attend that class-has gone 1 x, did well, likes the professor EDUCATION: School: [employer] Year/Grade: 2nd yr Homework Time: n/a all courses on line Performance/Grades: average Services: IEP/504 Plan-504 Activities/Exercise: some walking, likes swinging  MEDICAL HISTORY: Appetite: good MVI/Other: none Fruits/Vegs:some Calcium: cheese and yogurt Iron:some meats-chicken, shrimp  Sleep: Bedtime: 11 Awakens: 7 Sleep Concerns: Initiation/Maintenance/Other: sleeps well   Individual Medical History/Review of System Changes? No, had flu vaccine Review of Systems  Constitutional: Negative.  Negative for chills, diaphoresis, fever, malaise/fatigue and weight loss.  HENT: Negative.  Negative for congestion, ear discharge, ear pain, hearing loss, nosebleeds, sore throat and tinnitus.   Eyes: Negative.  Negative for blurred vision, double  vision, photophobia, pain, discharge and redness.  Respiratory: Negative.  Negative for cough, hemoptysis, sputum production, shortness of breath, wheezing and stridor.   Cardiovascular: Negative.  Negative for chest pain, palpitations, orthopnea, claudication, leg swelling and PND.  Gastrointestinal: Negative.  Negative for abdominal pain, blood in stool, constipation, diarrhea, heartburn, melena, nausea and vomiting.  Genitourinary: Negative.  Negative for dysuria, flank pain, frequency, hematuria and urgency.  Musculoskeletal: Negative.  Negative for back pain, falls, joint pain, myalgias and neck pain.  Skin: Negative.  Negative for itching and rash.  Neurological: Negative.  Negative for dizziness, tingling, tremors, sensory change, speech change, focal weakness, seizures, loss of consciousness, weakness and headaches.  Endo/Heme/Allergies: Negative.  Negative for environmental allergies and polydipsia. Does not bruise/bleed easily.  Psychiatric/Behavioral: Negative.  Negative for depression, hallucinations, memory loss, substance abuse and suicidal ideas. The patient is not nervous/anxious and does not have insomnia.    Allergies: Patient has no known allergies.  Current Medications:  Current Outpatient Prescriptions:  .  aspirin EC 325 MG tablet, Take 1 tablet (325 mg total) by mouth once., Disp: 21 tablet, Rfl: 0 .  cloNIDine (CATAPRES) 0.3 MG tablet, Take 1 tablet (0.3 mg total) by mouth at bedtime., Disp: 30 tablet, Rfl: 2 .  FLUoxetine (PROZAC) 20 MG tablet, Take 1 tablet (20 mg total) by mouth daily., Disp: 30 tablet, Rfl: 2 .  Melatonin 10 MG TABS, Take 1 tablet by mouth daily., Disp: , Rfl:  .  methylphenidate 54 MG PO CR tablet, Take 1 tablet (54 mg total) by mouth daily with breakfast., Disp: 30 tablet, Rfl: 0 Medication Side Effects: None  Family Medical/Social History Changes?: No  MENTAL HEALTH: Mental Health Issues: poor social skills  PHYSICAL EXAM: Vitals:  Today's  Vitals   05/14/16 0929  BP: 130/80  Weight: 186 lb 12 oz (84.7 kg)  Height: 5\' 2"  (1.575 m)  PainSc: 0-No pain  , Facility age limit for growth percentiles is 20 years.  General Exam: Physical Exam  Constitutional: He is oriented to person, place, and time. He appears well-developed and well-nourished. No distress.  HENT:  Head: Normocephalic and atraumatic.  Right Ear: External ear normal.  Left Ear: External ear normal.  Nose: Nose normal.  Mouth/Throat: Oropharynx is clear and moist. No oropharyngeal exudate.  Eyes: Conjunctivae and EOM are normal. Pupils are equal, round, and reactive to light. Right eye exhibits no discharge. Left eye exhibits no discharge. No scleral icterus.  Neck: Normal range of motion. Neck supple. No JVD present. No tracheal deviation present. No thyromegaly present.  Cardiovascular: Normal rate, regular rhythm, normal heart sounds and intact distal pulses.  Exam reveals no gallop and no friction rub.   No murmur heard. Pulmonary/Chest: Effort normal and breath sounds normal. No stridor. No respiratory distress. He has no wheezes. He has no rales. He exhibits no tenderness.  Abdominal: Soft. Bowel sounds are normal. He exhibits no distension and no mass. There is no tenderness. There is no rebound and no guarding. No hernia.  Musculoskeletal: Normal range of motion. He exhibits no edema, tenderness or deformity.  Lymphadenopathy:    He has no cervical adenopathy.  Neurological: He is alert and oriented to person, place, and time. He has normal reflexes. He displays normal reflexes. No cranial nerve deficit. He exhibits normal muscle tone. Coordination normal.  Skin: Skin is warm and dry. No rash noted. He is not diaphoretic. No erythema. No pallor.  Psychiatric: He has a normal mood and affect. His behavior is normal.  Vitals reviewed.   Neurological: oriented to time, place, and person Cranial Nerves: normal  Neuromuscular:  Motor Mass: normal Tone:  normal Strength: normal DTRs: normal 2+ and symmetric Overflow: mild Reflexes: no tremors noted, finger to nose without dysmetria bilaterally, performs thumb to finger exercise without difficulty, gait was normal and difficulty with tandem Sensory Exam: Vibratory: not done  Fine Touch: normal  Testing/Developmental Screens: CGI 7  DIAGNOSES:    ICD-9-CM ICD-10-CM   1. ADHD (attention deficit hyperactivity disorder), combined type 314.01 F90.2   2. Autistic disorder 299.00 F84.0   3. Generalized anxiety disorder 300.02 F41.1     RECOMMENDATIONS:  Patient Instructions  Continue  concerta 54 mg every morning  prozac 20 mg daily  Clonidine 0.3 mg at bedtime discussed weight/exercise Discussed school progress and return to the classroom  NEXT APPOINTMENT: Return in about 3 months (around 08/12/2016), or if symptoms worsen or fail to improve, for Medical follow up.   Nicholos Johns, NP Counseling Time: 30 Total Contact Time: 50 More than 50% of the visit involved counseling, discussing the diagnosis and management of symptoms with the patient and family        PCP: Carmin Richmond, MD  Accompanied by: Mother Patient Lives with: mother  HISTORY/CURRENT STATUS:  HPI  rooutine visit, medication check EDUCATION: School: [employer]  Year/Grade: 2nd year Homework Time: n/a Performance/Grades: average Services: Other: none Activities/Exercise: walks  MEDICAL HISTORY: Appetite: good MVI/Other: none Fruits/Vegs:fair Calcium: some dairy products Iron:good with meats and seafoods  Sleep: Bedtime: 10 Awakens: 7 Sleep Concerns: Initiation/Maintenance/Other: sleeps well  Individual Medical History/Review of System Changes? No Review of Systems  Constitutional: Negative.  Negative for chills, diaphoresis, fever, malaise/fatigue and weight loss.  HENT: Negative.  Negative for congestion, ear discharge, ear pain, hearing loss, nosebleeds, sinus pain, sore throat and tinnitus.  Eyes: Negative.  Negative for blurred vision, double vision, photophobia, pain, discharge and redness.  Respiratory: Negative.  Negative for cough, hemoptysis, sputum production, shortness of breath, wheezing and stridor.   Cardiovascular: Negative.  Negative for chest pain, palpitations, orthopnea, claudication, leg swelling and PND.  Gastrointestinal: Negative for abdominal pain, blood in stool, constipation, diarrhea, heartburn, melena, nausea and vomiting.  Genitourinary: Negative.  Negative for dysuria, flank pain, frequency, hematuria and urgency.  Musculoskeletal: Negative.  Negative for back pain, falls, joint pain, myalgias and neck pain.  Skin: Negative.  Negative for itching and rash.  Neurological: Negative.  Negative for dizziness, tingling, tremors, sensory change, speech change, focal weakness, seizures, loss of consciousness, weakness and headaches.  Endo/Heme/Allergies: Negative.  Negative for environmental allergies and polydipsia. Does not bruise/bleed easily.  Psychiatric/Behavioral: Negative.  Negative for depression, hallucinations, memory loss, substance abuse and suicidal ideas. The patient is not nervous/anxious and does not have insomnia.     Allergies: Patient has no known allergies.  Current Medications:  Current Outpatient Prescriptions:  .  aspirin EC 325 MG tablet, Take 1 tablet (325 mg total) by mouth once., Disp: 21 tablet, Rfl: 0 .  cloNIDine (CATAPRES) 0.3 MG tablet, Take 1 tablet (0.3 mg total) by mouth at bedtime., Disp: 30 tablet, Rfl: 2 .  FLUoxetine (PROZAC) 20 MG tablet, Take 1 tablet (20 mg total) by mouth daily., Disp: 30 tablet, Rfl: 2 .  Melatonin 10 MG TABS, Take 1 tablet by mouth daily., Disp: , Rfl:  .  methylphenidate 54 MG PO CR tablet, Take 1 tablet (54 mg total) by mouth daily with breakfast., Disp: 30 tablet, Rfl: 0 Medication Side Effects: Other: none  Family Medical/Social History Changes?: No  MENTAL HEALTH: Mental Health Issues:  poor social skills  PHYSICAL EXAM: Vitals:  Today's Vitals   05/14/16 0929  BP: 130/80  Weight: 186 lb 12 oz (84.7 kg)  Height: 5\' 2"  (1.575 m)  PainSc: 0-No pain  , Facility age limit for growth percentiles is 20 years.  General Exam: Physical Exam  Constitutional: He is oriented to person, place, and time. He appears well-developed and well-nourished. No distress.  HENT:  Head: Normocephalic and atraumatic.  Right Ear: External ear normal.  Left Ear: External ear normal.  Nose: Nose normal.  Mouth/Throat: Oropharynx is clear and moist. No oropharyngeal exudate.  Eyes: Conjunctivae and EOM are normal. Pupils are equal, round, and reactive to light. Right eye exhibits no discharge. Left eye exhibits no discharge. No scleral icterus.  Neck: Normal range of motion. Neck supple. No JVD present. No tracheal deviation present. No thyromegaly present.  Cardiovascular: Normal rate, regular rhythm, normal heart sounds and intact distal pulses.  Exam reveals no gallop and no friction rub.   No murmur heard. Pulmonary/Chest: Effort normal and breath sounds normal. No stridor. No respiratory distress. He has no wheezes. He has no rales. He exhibits no tenderness.  Abdominal: Soft. Bowel sounds are normal. He exhibits no distension and no mass. There is no tenderness. There is no rebound and no guarding. No hernia.  Musculoskeletal: Normal range of motion. He exhibits no edema, tenderness or deformity.  Lymphadenopathy:    He has no cervical adenopathy.  Neurological: He is alert and oriented to person, place, and time. He has normal reflexes. He displays normal reflexes. No cranial nerve deficit or sensory deficit. He exhibits normal muscle tone. Coordination normal.  Skin: Skin is warm and dry. No rash noted. He is not diaphoretic. No erythema. No pallor.  Psychiatric: He has a normal mood and affect. His behavior is normal. Judgment and thought content normal.  Vitals  reviewed.   Neurological: oriented to time, place, and person Cranial Nerves: normal  Neuromuscular:  Motor Mass: normal Tone: normal Strength: normal DTRs: normal 2+ and symmetric Overflow: mild Reflexes: no tremors noted Sensory Exam: Vibratory: not done  Fine Touch: normal  Testing/Developmental Screens: AS/RS   DIAGNOSES:    ICD-9-CM ICD-10-CM   1. ADHD (attention deficit hyperactivity disorder), combined type 314.01 F90.2   2. Autistic disorder 299.00 F84.0   3. Generalized anxiety disorder 300.02 F41.1     RECOMMENDATIONS:  Patient Instructions  Continue  concerta 54 mg every morning  prozac 20 mg daily  Clonidine 0.3 mg at bedtime discussed growth and development-doing well, maturing, more tolerant Discussed transfer to new provider Discussed school progress-doing well  NEXT APPOINTMENT: Return in about 3 months (around 08/12/2016), or if symptoms worsen or fail to improve, for Medical follow up.   Nicholos Johns, NP Counseling Time: 30 Total Contact Time: 50 More than 50% of the visit involved counseling, discussing the diagnosis and management of symptoms with the patient and family

## 2016-05-14 NOTE — Patient Instructions (Signed)
Continue  concerta 54 mg every morning  prozac 20 mg daily  Clonidine 0.3 mg at bedtime

## 2016-07-02 ENCOUNTER — Other Ambulatory Visit: Payer: Self-pay | Admitting: Family

## 2016-07-02 MED ORDER — METHYLPHENIDATE HCL ER (OSM) 54 MG PO TBCR: 54.0000 mg | EXTENDED_RELEASE_TABLET | Freq: Every day | ORAL | 0 refills | Status: DC

## 2016-07-02 NOTE — Telephone Encounter (Signed)
Printed the Rx for Concerta 54 and placed in the mail bag for next outgoing mail  

## 2016-07-02 NOTE — Telephone Encounter (Signed)
Mom called for refill for Concerta.  Patient last seen 05/14/16, next appointment 08/13/16.  Please mail to home address.

## 2016-07-09 ENCOUNTER — Other Ambulatory Visit: Payer: Self-pay | Admitting: Pediatrics

## 2016-07-09 MED ORDER — CLONIDINE HCL 0.3 MG PO TABS: 0.3000 mg | ORAL_TABLET | Freq: Every day | ORAL | 2 refills | Status: DC

## 2016-07-09 NOTE — Telephone Encounter (Signed)
[facility].  Patient last seen 05/14/16, next appointment 08/13/16.

## 2016-07-09 NOTE — Telephone Encounter (Signed)
E-Prescribed clonidine 0.3 mg Q HS directly to pharmacy [facility] on [address]

## 2016-08-01 ENCOUNTER — Other Ambulatory Visit: Payer: Self-pay | Admitting: Pediatrics

## 2016-08-01 MED ORDER — METHYLPHENIDATE HCL ER (OSM) 54 MG PO TBCR: 54.0000 mg | EXTENDED_RELEASE_TABLET | Freq: Every day | ORAL | 0 refills | Status: DC

## 2016-08-01 NOTE — Telephone Encounter (Signed)
Printed Rx and placed at front desk for pick-up  

## 2016-08-01 NOTE — Telephone Encounter (Signed)
Mom called for refill of Concerta.  Patient last seen 05/14/16, next appointment 08/13/16.  Please mail to home address.

## 2016-08-13 ENCOUNTER — Encounter: Payer: Self-pay | Admitting: Family

## 2016-08-13 ENCOUNTER — Ambulatory Visit (INDEPENDENT_AMBULATORY_CARE_PROVIDER_SITE_OTHER): Payer: Managed Care, Other (non HMO) | Admitting: Family

## 2016-08-13 VITALS — BP 128/72 | HR 80 | Resp 16 | Ht 62.0 in | Wt 187.0 lb

## 2016-08-13 DIAGNOSIS — Z79899 Other long term (current) drug therapy: Secondary | ICD-10-CM

## 2016-08-13 DIAGNOSIS — F900 Attention-deficit hyperactivity disorder, predominantly inattentive type: Secondary | ICD-10-CM | POA: Diagnosis not present

## 2016-08-13 DIAGNOSIS — F411 Generalized anxiety disorder: Secondary | ICD-10-CM

## 2016-08-13 DIAGNOSIS — F840 Autistic disorder: Secondary | ICD-10-CM

## 2016-08-13 DIAGNOSIS — Z72821 Inadequate sleep hygiene: Secondary | ICD-10-CM

## 2016-08-13 MED ORDER — FLUOXETINE HCL 20 MG PO TABS: 20.0000 mg | ORAL_TABLET | Freq: Every day | ORAL | 2 refills | Status: DC

## 2016-08-13 MED ORDER — METHYLPHENIDATE HCL ER (OSM) 54 MG PO TBCR: 54.0000 mg | EXTENDED_RELEASE_TABLET | Freq: Every day | ORAL | 0 refills | Status: DC

## 2016-08-13 NOTE — Progress Notes (Signed)
[facility] [address] [place]. [address] [place] [address] Dept: [phone] Dept Fax: [phone] Loc: [phone] Loc Fax: [phone]  Medical Follow-up  Patient ID: Fernando Davies, male  DOB: [birth year], 20 y.o.  MRN: [id number]  Date of Evaluation: 08/13/16  PCP: Carmin Richmond, MD  Accompanied by: Mother Patient Lives with: mother and brother. Dad passed away 4 years ago.   HISTORY/CURRENT STATUS:  HPI  Patient here for routine follow up related to ADHD and medication management. Patient here with mother for today's visit. Patient doing well at school this semester and maybe classes this summer or activities to get involved in. Works on classes mostly online since his fall from the swing with a broken ankle. Mother would like him to be on campus more. Patient not really liking that idea, but is willing to try it. Has continued with Clonidine for sleep, Concerta 54 mg daily and Prozac with no side effects reports with medication regimen.   EDUCATION: School: [employer] Year/Grade: 2nd year Homework Time: not much, but labs on campus for [employer] 1 day/week Performance/Grades: above average-Mostly A's Services: IEP/504 Plan and Other: Disability services on campus Activities/Exercise: intermittently, walking by himself and likes to swing.   MEDICAL HISTORY: Appetite: Good MVI/Other: Daily MVI Fruits/Vegs:peas, bananas.corn, but picky Calcium: milkshakes now 2x's/week Iron:Chicken tenders  Sleep: Bedtime: 11:00 pm  Awakens: 7-7:15 am most weekdays.  Sleep Concerns: Initiation/Maintenance/Other: Clonidine and Melatonin 10 mg at HS.   Individual Medical History/Review of System Changes? None recently. No doctor's visits recently.   Allergies: Patient has no known allergies.  Current Medications:  Current Outpatient Prescriptions:  .  aspirin EC 325 MG  tablet, Take 1 tablet (325 mg total) by mouth once., Disp: 21 tablet, Rfl: 0 .  cloNIDine (CATAPRES) 0.3 MG tablet, Take 1 tablet (0.3 mg total) by mouth at bedtime., Disp: 30 tablet, Rfl: 2 .  FLUoxetine (PROZAC) 20 MG tablet, Take 1 tablet (20 mg total) by mouth daily., Disp: 30 tablet, Rfl: 2 .  Melatonin 10 MG TABS, Take 1 tablet by mouth daily., Disp: , Rfl:  .  methylphenidate 54 MG PO CR tablet, Take 1 tablet (54 mg total) by mouth daily with breakfast. Do not refill until 10/10/16., Disp: 30 tablet, Rfl: 0 Medication Side Effects: None  Family Medical/Social History Changes?: None reported recently.   MENTAL HEALTH: Mental Health Issues: Anxiety-Prozac recently to assist with symptoms  PHYSICAL EXAM: Vitals:  Today's Vitals   08/13/16 0845  BP: 128/72  Pulse: 80  Resp: 16  Weight: 187 lb (84.8 kg)  Height:  (1.575 m)  PainSc: 0-No pain  , Facility age limit for growth percentiles is 20 years.  General Exam: Physical Exam  Constitutional: He is oriented to person, place, and time. He appears well-developed and well-nourished.  HENT:  Head: Normocephalic and atraumatic.  Right Ear: External ear normal.  Left Ear: External ear normal.  Nose: Nose normal.  Mouth/Throat: Oropharynx is clear and moist.  Eyes: Conjunctivae and EOM are normal. Pupils are equal, round, and reactive to light.  Neck: Trachea normal, normal range of motion and full passive range of motion without pain. Neck supple.  Cardiovascular: Normal rate, regular rhythm, normal heart sounds and intact distal pulses.   Pulmonary/Chest: Effort normal and breath sounds normal.  Abdominal: Soft. Bowel sounds are normal.  Genitourinary:  Genitourinary Comments: Deferred   Musculoskeletal: Normal range of motion.  Neurological: He is alert and oriented to person, place, and time. He has normal reflexes.  Skin: Skin is warm, dry and intact. Capillary refill takes less than 2 seconds.  Psychiatric: He has a  normal mood and affect. His behavior is normal. Judgment and thought content normal.  Vitals reviewed. Review of Systems  Psychiatric/Behavioral: The patient is nervous/anxious.   All other systems reviewed and are negative. No concerns for toileting. Daily stool, no constipation or diarrhea. Void urine no difficulty. No enuresis.   Participate in daily oral hygiene to include brushing and flossing.   Neurological: oriented to time, place, and person Cranial Nerves: normal  Neuromuscular:  Motor Mass: Normal Tone: Normal Strength: Normal  DTRs: 2+ and symmetric Overflow: None Reflexes: no tremors noted Sensory Exam: Vibratory: Intact  Fine Touch: Intact  Testing/Developmental Screens: CGI:   DIAGNOSES:    ICD-9-CM ICD-10-CM   1. ADHD (attention deficit hyperactivity disorder), inattentive type 314.00 F90.0   2. Autism 299.00 F84.0   3. Generalized anxiety disorder 300.02 F41.1   4. Medication management V58.69 Z79.899   5. History of difficulty sleeping V49.89 Z72.821     RECOMMENDATIONS: 3 month follow up and continue with medication. To continue with Concerta 54 mg daily, #30 printed and given to mother. Two prescriptions provided, one with fill after dates for 10/10/16. Prozac 20 mg daily # 30 with 2 RF's printed and given to mother today. Will continue with Clonidine 0.3 mg at HS no script needed at today's visit.   Discussed transitioning [name] to PCP or Family Practice MD related to age and need for routine follow up visit along with blood work.  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 Mother to look into options for [name] to be more involved this summer instead of being at home.  Reviewed sleep hygiene with history of sleep initiation problems. To continue with Melatonin along with Clonidine use at HS. Also can given snack with increase carbs for melatonin release.   Exercise discussed with more  walking around the neighborhood and more for on campus classes. Scared of increased activity since broken ankle, but getting better with suggestions.  Mother and [name] to follow new diet plan and helping each other with watching intake amount at each meal along with decreasing soft drink intake and milkshakes.   NEXT APPOINTMENT: Return in about 3 months (around 11/12/2016) for follow up visit.  More than 50% of the appointment was spent counseling and discussing diagnosis and management of symptoms with the patient and family.  Carron Curie, NP Counseling Time: 30 mins Total Contact Time: 40 mins

## 2016-09-03 ENCOUNTER — Other Ambulatory Visit: Payer: Self-pay | Admitting: Family

## 2016-09-03 MED ORDER — FLUOXETINE HCL 20 MG PO TABS: 20.0000 mg | ORAL_TABLET | Freq: Every day | ORAL | 2 refills | Status: DC

## 2016-09-03 NOTE — Telephone Encounter (Signed)
[facility] Flu[facility] 20 [facility].  Patient last seen 08/13/16, next appointment 11/09/16.

## 2016-09-03 NOTE — Telephone Encounter (Signed)
E-scribed fluoxetine 20 mg #30 with 2 refills to [facility] [address]

## 2016-09-07 ENCOUNTER — Other Ambulatory Visit: Payer: Self-pay | Admitting: Pediatrics

## 2016-09-07 MED ORDER — CLONIDINE HCL 0.3 MG PO TABS: 0.3000 mg | ORAL_TABLET | Freq: Every day | ORAL | 2 refills | Status: DC

## 2016-09-07 NOTE — Telephone Encounter (Signed)
RX for Clonidine 0.3 mg at HS, # 30 with 2 RF's e-scribed and sent to [facility].

## 2016-09-07 NOTE — Telephone Encounter (Signed)
[facility] refill for Clonidine 0.3 mg.  Patient last seen 08/13/16, next appointment 11/09/16.

## 2016-10-10 ENCOUNTER — Other Ambulatory Visit: Payer: Self-pay | Admitting: Family

## 2016-10-10 ENCOUNTER — Telehealth: Payer: Self-pay | Admitting: Family

## 2016-10-10 MED ORDER — METHYLPHENIDATE HCL ER (OSM) 54 MG PO TBCR: 54.0000 mg | EXTENDED_RELEASE_TABLET | Freq: Every day | ORAL | 0 refills | Status: DC

## 2016-10-10 NOTE — Telephone Encounter (Signed)
T/C from mother regarding misplacing script given on 08/13/16, Printed Rx and placed at front desk for pick-up-Concerta 54 mg daily. To pick up today.

## 2016-10-10 NOTE — Telephone Encounter (Signed)
Mom called for refill for Concerta.  Patient last seen 08/13/16, next appointment 11/09/16.  Needs as soon as possible.

## 2016-10-11 MED ORDER — METHYLPHENIDATE HCL ER (OSM) 54 MG PO TBCR: 54.0000 mg | EXTENDED_RELEASE_TABLET | Freq: Every day | ORAL | 0 refills | Status: DC

## 2016-10-11 NOTE — Telephone Encounter (Signed)
Printed Rx and placed at front desk for pick-up Telephone call with mother, lost/misplaced last RX

## 2016-10-19 ENCOUNTER — Telehealth: Payer: Self-pay | Admitting: Family

## 2016-10-19 NOTE — Telephone Encounter (Signed)
° °  Faxed invoice for $40.00 to Fernando Davies, [address]  When payment received, will send medical records. tl

## 2016-10-25 ENCOUNTER — Telehealth: Payer: Self-pay | Admitting: Pediatrics

## 2016-10-25 NOTE — Telephone Encounter (Signed)
° ° °  Records mailed to law office of Miki KinsJason Wilson 10/25/16. tl

## 2016-11-09 ENCOUNTER — Ambulatory Visit (INDEPENDENT_AMBULATORY_CARE_PROVIDER_SITE_OTHER): Payer: Managed Care, Other (non HMO) | Admitting: Family

## 2016-11-09 ENCOUNTER — Encounter: Payer: Self-pay | Admitting: Family

## 2016-11-09 VITALS — BP 108/68 | HR 78 | Resp 16 | Ht 62.25 in | Wt 189.0 lb

## 2016-11-09 DIAGNOSIS — F900 Attention-deficit hyperactivity disorder, predominantly inattentive type: Secondary | ICD-10-CM | POA: Diagnosis not present

## 2016-11-09 DIAGNOSIS — Z79899 Other long term (current) drug therapy: Secondary | ICD-10-CM | POA: Diagnosis not present

## 2016-11-09 DIAGNOSIS — F411 Generalized anxiety disorder: Secondary | ICD-10-CM | POA: Diagnosis not present

## 2016-11-09 DIAGNOSIS — F840 Autistic disorder: Secondary | ICD-10-CM | POA: Diagnosis not present

## 2016-11-09 MED ORDER — METHYLPHENIDATE HCL ER (OSM) 54 MG PO TBCR: 54.0000 mg | EXTENDED_RELEASE_TABLET | Freq: Every day | ORAL | 0 refills | Status: DC

## 2016-11-09 MED ORDER — CLONIDINE HCL 0.3 MG PO TABS: 0.3000 mg | ORAL_TABLET | Freq: Every day | ORAL | 2 refills | Status: DC

## 2016-11-09 MED ORDER — FLUOXETINE HCL 20 MG PO TABS: 20.0000 mg | ORAL_TABLET | Freq: Every day | ORAL | 2 refills | Status: DC

## 2016-11-09 NOTE — Progress Notes (Signed)
[facility] [address], [place]. [address] [place] [address] Dept: [phone] Dept Fax: [phone] Loc: [phone] Loc Fax: [phone]  Medical Follow-up  Patient ID: Fernando Davies, male  DOB: March 30, 1996, 21 y.o.  MRN: [id number]  Date of Evaluation: 11/09/16  PCP: Eliberto Ivory, MD  Accompanied by: Mother and self Patient Lives with: mother and stepfather  HISTORY/CURRENT STATUS:  HPI  Patient here for routine follow up related to ADHD, Autism, Anxiety, and medication management. Fernando Davies is here with his mother for today's visit. He is interactive when asked questions and will respond after a slight pausing with waiting for his mother to give an answer.  Patient doing well at school this past semester and working on personal projects this summer. At times will go with mother to work and personally doing his own things while there.  Patient has continued with the same medication regimen with no reported side effects.   EDUCATION: School: [employer] Year/Grade: 2nd year Homework Time: Not now Performance/Grades: above average Services: Other: Disability services on campus Activities/Exercise: intermittently-working on personal projects, beach, movies, staying with grandparents next week, swimming, and walking.   MEDICAL HISTORY: Appetite: Good MVI/Other: Daily MVI Fruits/Vegs:peas, bananas, corn, but picky Calcium: milkshakes now Wednesday and Saturday.  Iron:Chicken tenders, protein bars  Sleep: Bedtime: 11:00 pm  Awakens:  Sleep Concerns: Initiation/Maintenance/Other: Occasionally waking after falling asleep and going right back to sleep. Clonidine and Melatonin 10 mg at HS.   Individual Medical History/Review of System Changes? None recently reported by patient.   Allergies: Patient has no known allergies.  Current Medications:  Current Outpatient  Prescriptions:  .  cloNIDine (CATAPRES) 0.3 MG tablet, Take 1 tablet (0.3 mg total) by mouth at bedtime., Disp: 30 tablet, Rfl: 2 .  FLUoxetine (PROZAC) 20 MG tablet, Take 1 tablet (20 mg total) by mouth daily., Disp: 30 tablet, Rfl: 2 .  Melatonin 10 MG TABS, Take 1 tablet by mouth daily., Disp: , Rfl:  .  methylphenidate 54 MG PO CR tablet, Take 1 tablet (54 mg total) by mouth daily with breakfast. Do not fill until 01/08/17., Disp: 30 tablet, Rfl: 0 Medication Side Effects: None  Family Medical/Social History Changes?: None reported recently. Expecting a new nephew with older brother.   MENTAL HEALTH: Mental Health Issues: Anxiety  PHYSICAL EXAM: Vitals:  Today's Vitals   11/09/16 0955  BP: 108/68  Pulse: 78  Resp: 16  Weight: 189 lb (85.7 kg)  Height: 5' 2.25" (1.581 m)  PainSc: 0-No pain  , Facility age limit for growth percentiles is 20 years.  General Exam: Physical Exam  Constitutional: He is oriented to person, place, and time. He appears well-developed and well-nourished.  HENT:  Head: Normocephalic and atraumatic.  Right Ear: External ear normal.  Left Ear: External ear normal.  Nose: Nose normal.  Mouth/Throat: Oropharynx is clear and moist.  Eyes: Pupils are equal, round, and reactive to light. Conjunctivae and EOM are normal.  Neck: Trachea normal, normal range of motion and full passive range of motion without pain. Neck supple.  Cardiovascular: Normal rate, regular rhythm, normal heart sounds and intact distal pulses.   Pulmonary/Chest: Effort normal and breath sounds normal.  Abdominal: Soft. Bowel sounds are normal.  Genitourinary:  Genitourinary Comments: Deferred  Musculoskeletal: Normal range of motion.  Neurological: He is alert and oriented to person, place, and time. He has normal reflexes.  Skin: Skin is warm, dry  and intact. Capillary refill takes less than 2 seconds.  Psychiatric: He has a normal mood and affect. His behavior is normal. Judgment  and thought content normal.  Vitals reviewed.  Review of Systems  Psychiatric/Behavioral: Positive for decreased concentration and sleep disturbance.  All other systems reviewed and are negative.  No concerns for toileting. Daily stool, no constipation or diarrhea. Void urine no difficulty. No enuresis.   Participate in daily oral hygiene to include brushing and flossing.  Neurological: oriented to time, place, and person Cranial Nerves: normal  Neuromuscular:  Motor Mass: Normal Tone: Normal Strength: Normal DTRs: 2+ and symmetric Overflow: None Reflexes: no tremors noted Sensory Exam: Vibratory: Intact  Fine Touch: Intact  Testing/Developmental Screens: CGI:6/30 scored by mother and counseled   DIAGNOSES:    ICD-10-CM   1. ADHD (attention deficit hyperactivity disorder), inattentive type F90.0   2. Generalized anxiety disorder F41.1   3. Autism spectrum disorder F84.0   4. Medication management Z79.899     RECOMMENDATIONS: 3 month follow up and continuation with medication. Proza20 mg daily, # 30 with 2 RF's and Clonidine 0.3 mg 1 at HS, # 30 with 2 RF's [facility]. Concerta 54 mg daily, # 30 with no refills printed and given to mother. Three prescriptions provided, two with fill after dates for 12/08/16 and 01/08/17.  Counseled patient on class schedule and school attendance this coming fall. He will be taking more of his classes toward his major and is excited about this.  Recommended increasing physical activity and has been outside more with summer activities, swimming and beach, also encouraged to move at least 20-30 minutes daily for cardiovascular health.   Reviewed techniques to use for anxiety when patient symptoms increase and mother verbalized assisting patient with these.  Information reviewed on dietary changes with still limiting most foods, but is willing to try some new things recently. To encourage more fruits, vegetables, lean meats with limiting  carbohydrates when possible.   Advocated for patient to become involved with ASD groups or outings. Informed mother of several different things [place] participate in around [place]. Mother to apply for SSI for disability since patient is 21 years old and also [facility] assistance. Will also explore [employer].   Directed to follow up with PCP yearly, Dentist every 6 months, increasing healthy foods, exercising more, MVI daily, and participation in social group for health maintenance.   NEXT APPOINTMENT: Return in about 3 months (around 02/09/2017) for follow up visit.  More than 50% of the appointment was spent counseling and discussing diagnosis and management of symptoms with the patient and family.  Carron Curieawn M Paretta-Leahey, NP Counseling Time: 30 mins Total Contact Time: 40 mins

## 2016-12-25 IMAGING — RF DG ANKLE COMPLETE 3+V*R*
1 series · 3 of 3 positions shown · non-contrast
Comparison: Plain films 05/12/2015.

CLINICAL DATA: Status post fall with a right ankle fracture
05/12/2015. Intraoperative fixation images.

EXAM:
RIGHT ANKLE - COMPLETE 3+ VIEW; DG C-ARM 61-120 MIN

[Series 1: run · 3 of 3 slices shown]
[im 1/3]
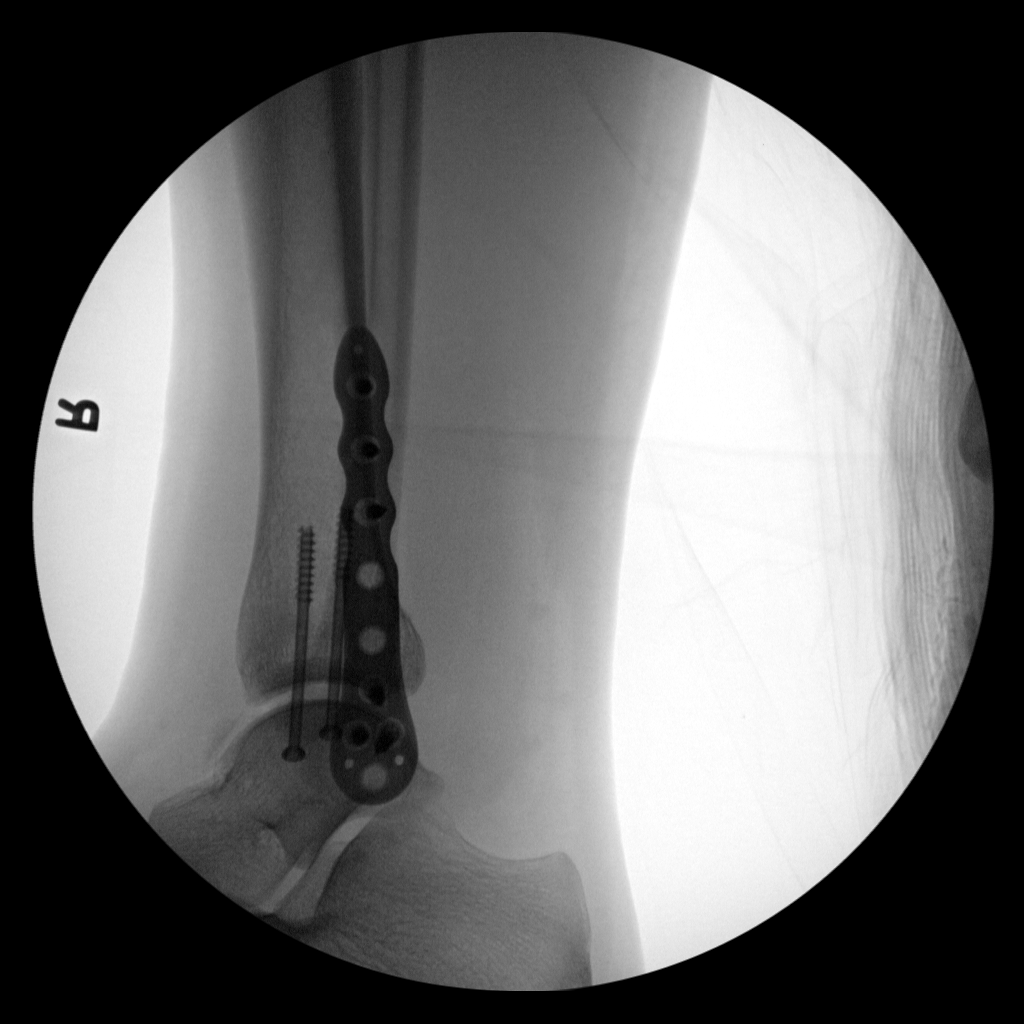
[im 2/3]
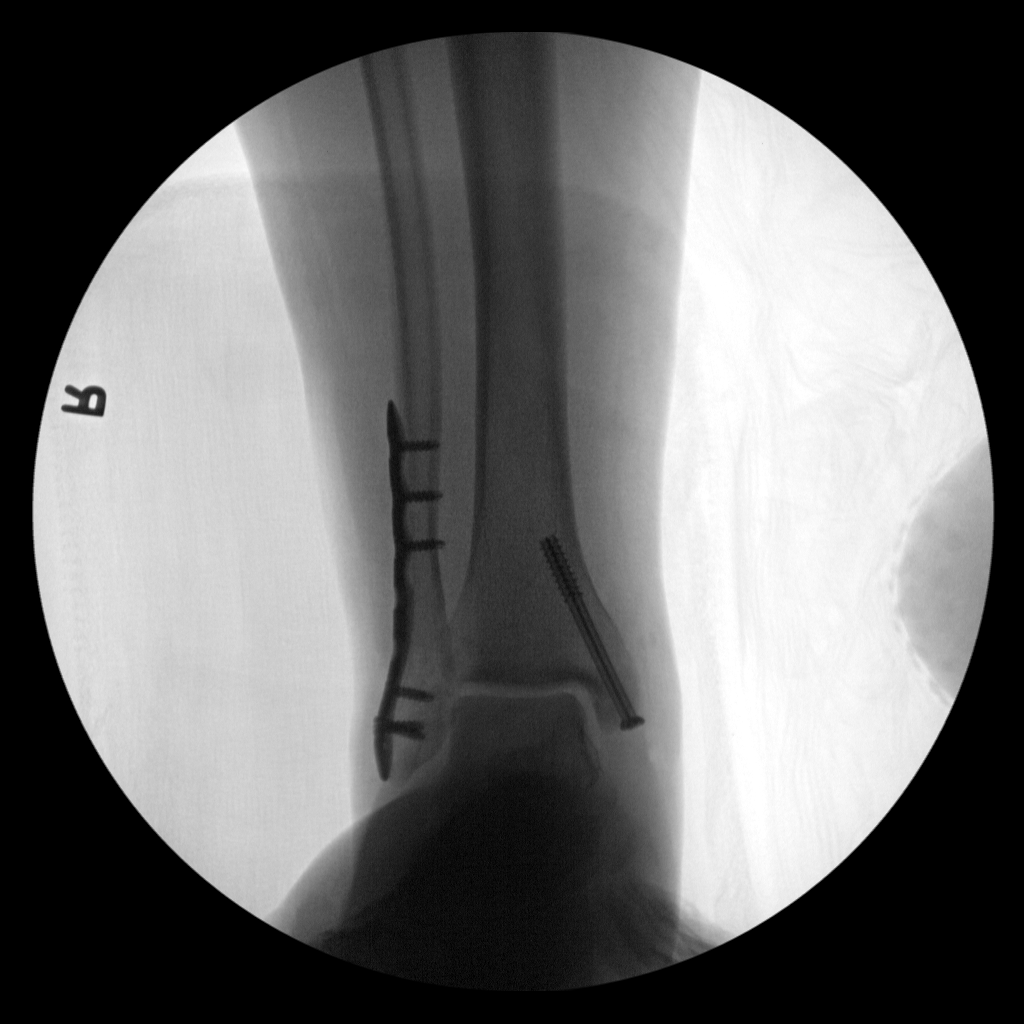
[im 3/3]
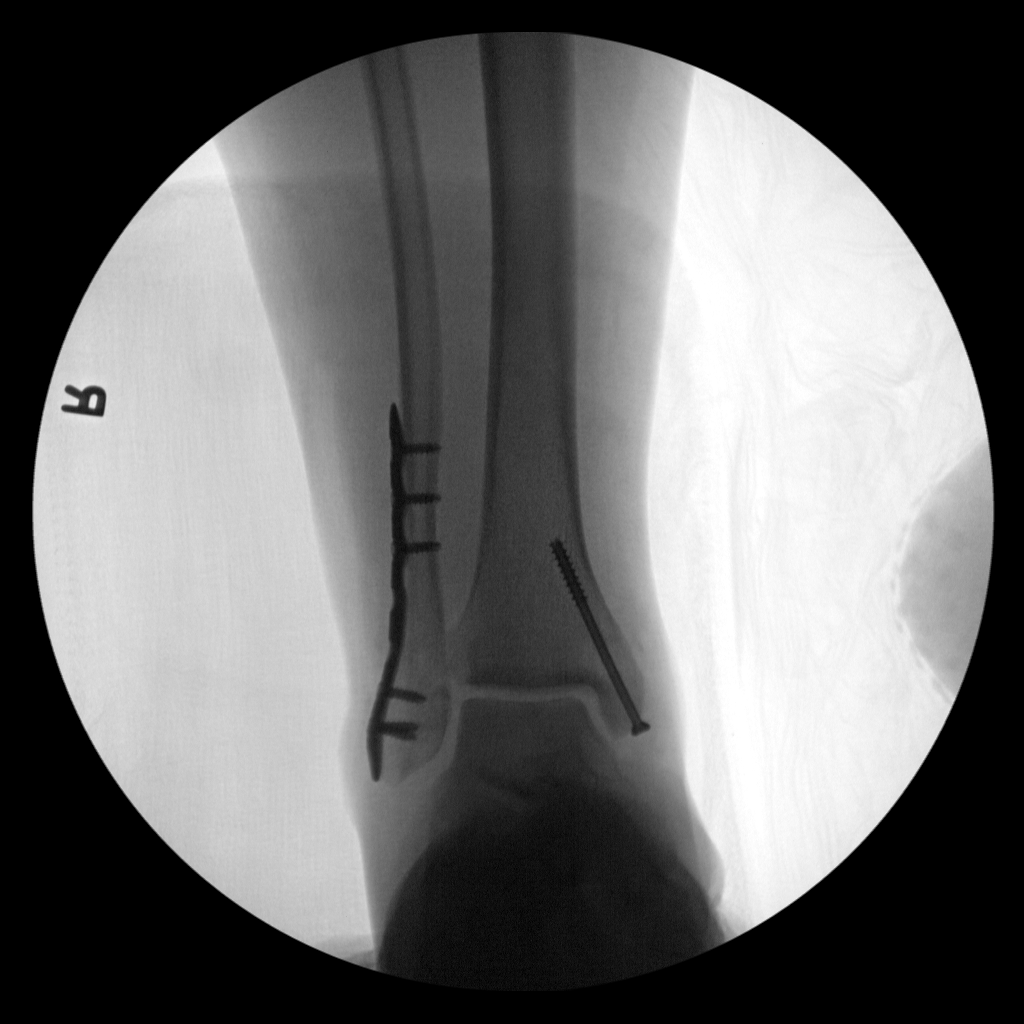

[3 of 3 positions shown; findings below may reference images not displayed]

FINDINGS: We are provided with 3 fluoroscopic intraoperative spot views of the
right ankle. Images demonstrate 2 screws in the medial malleolus and
lateral plate and screws for fixation of medial and lateral
malleolar fractures. Position and alignment are anatomic. The
tibiotalar joint is located.
IMPRESSION: ORIF right ankle fractures without evidence complication.

## 2017-01-22 ENCOUNTER — Ambulatory Visit (INDEPENDENT_AMBULATORY_CARE_PROVIDER_SITE_OTHER): Payer: 59 | Admitting: Pediatrics

## 2017-01-22 ENCOUNTER — Encounter: Payer: Self-pay | Admitting: Pediatrics

## 2017-01-22 VITALS — BP 130/80 | Ht 62.25 in | Wt 187.0 lb

## 2017-01-22 DIAGNOSIS — Z79899 Other long term (current) drug therapy: Secondary | ICD-10-CM | POA: Diagnosis not present

## 2017-01-22 DIAGNOSIS — Z7189 Other specified counseling: Secondary | ICD-10-CM

## 2017-01-22 DIAGNOSIS — Z719 Counseling, unspecified: Secondary | ICD-10-CM | POA: Diagnosis not present

## 2017-01-22 DIAGNOSIS — Z7182 Exercise counseling: Secondary | ICD-10-CM

## 2017-01-22 DIAGNOSIS — F900 Attention-deficit hyperactivity disorder, predominantly inattentive type: Secondary | ICD-10-CM

## 2017-01-22 DIAGNOSIS — Z713 Dietary counseling and surveillance: Secondary | ICD-10-CM

## 2017-01-22 DIAGNOSIS — F411 Generalized anxiety disorder: Secondary | ICD-10-CM | POA: Diagnosis not present

## 2017-01-22 DIAGNOSIS — F840 Autistic disorder: Secondary | ICD-10-CM

## 2017-01-22 MED ORDER — CLONIDINE HCL 0.3 MG PO TABS: 0.3000 mg | ORAL_TABLET | Freq: Every day | ORAL | 2 refills | Status: DC

## 2017-01-22 MED ORDER — FLUOXETINE HCL 20 MG PO TABS: 20.0000 mg | ORAL_TABLET | Freq: Every day | ORAL | 2 refills | Status: DC

## 2017-01-22 MED ORDER — METHYLPHENIDATE HCL ER (OSM) 54 MG PO TBCR: 54.0000 mg | EXTENDED_RELEASE_TABLET | Freq: Every day | ORAL | 0 refills | Status: DC

## 2017-01-22 NOTE — Patient Instructions (Addendum)
Continue concerta 54 mg every  Morning  prozac 20 mg daily  Clonidine 0.3 mg at bedtime  Melatonin 10 mg at bedtime Discussed medication Discussed development-has lost 2 pounds, cont to progress Discussed school progress-doing well this year Discussed progress in independence Recommend flu vaccine Discussed diet and fitness program

## 2017-01-22 NOTE — Progress Notes (Signed)
[facility] [address] [place]. [address] [place] [address] Dept: [phone] Dept Fax: [phone] Loc: [phone] Loc Fax: [phone]  Medical Follow-up  Patient ID: Fernando Davies, male  DOB: 25-Sep-1995, 21 y.o.  MRN: [id number]  Date of Evaluation: 01/22/17  PCP: Eliberto Ivory, MD  Accompanied by: Mother Patient Lives with: mother  HISTORY/CURRENT STATUS:  HPI  Routine 3 month visit Return to school this fall-taking classes he likes better, doing well Has been apprehensive recently-had hearing for SSI-has been approved To have a new nephew next month-he is excited, likes his niece a lot  EDUCATION: School: [employer] Year/Grade: 2nd yr Homework Time: 3 on line classes, fitness, psychology and intro to theater Performance/Grades: average Services: Other: none Activities/Exercise: doing some exercises  MEDICAL HISTORY: Appetite: good, trying to eat healthier MVI/Other: none Fruits/Vegs:fair Calcium: some milk and yogurt Iron:likes meats  Sleep: Bedtime: varies Awakens: varies Sleep Concerns: Initiation/Maintenance/Other: sleeps well   Individual Medical History/Review of System Changes? No Review of Systems  Constitutional: Negative.  Negative for chills, diaphoresis, fever, malaise/fatigue and weight loss.  HENT: Negative.  Negative for congestion, ear discharge, ear pain, hearing loss, nosebleeds, sinus pain, sore throat and tinnitus.   Eyes: Negative.  Negative for blurred vision, double vision, photophobia, pain, discharge and redness.  Respiratory: Negative.  Negative for cough, hemoptysis, sputum production, shortness of breath, wheezing and stridor.   Cardiovascular: Negative.  Negative for chest pain, palpitations, orthopnea, claudication, leg swelling and PND.  Gastrointestinal: Negative.  Negative for abdominal pain, blood in stool,  constipation, diarrhea, heartburn, melena, nausea and vomiting.  Genitourinary: Negative.  Negative for dysuria, flank pain, frequency, hematuria and urgency.  Musculoskeletal: Negative.  Negative for back pain, falls, joint pain, myalgias and neck pain.  Skin: Negative.  Negative for itching and rash.  Neurological: Negative.  Negative for dizziness, tingling, tremors, sensory change, speech change, focal weakness, seizures, loss of consciousness, weakness and headaches.  Endo/Heme/Allergies: Negative.  Negative for environmental allergies and polydipsia. Does not bruise/bleed easily.  Psychiatric/Behavioral: Negative.  Negative for depression, hallucinations, memory loss, substance abuse and suicidal ideas. The patient is not nervous/anxious and does not have insomnia.     Allergies: Patient has no known allergies.  Current Medications:  Current Outpatient Prescriptions:  .  cloNIDine (CATAPRES) 0.3 MG tablet, Take 1 tablet (0.3 mg total) by mouth at bedtime., Disp: 30 tablet, Rfl: 2 .  FLUoxetine (PROZAC) 20 MG tablet, Take 1 tablet (20 mg total) by mouth daily., Disp: 30 tablet, Rfl: 2 .  Melatonin 10 MG TABS, Take 1 tablet by mouth daily., Disp: , Rfl:  .  methylphenidate 54 MG PO CR tablet, Take 1 tablet (54 mg total) by mouth daily with breakfast. Do not fill until 04/09/17, Disp: 30 tablet, Rfl: 0 Medication Side Effects: None  Family Medical/Social History Changes?: No  MENTAL HEALTH: Mental Health Issues: Anxiety and poor social skills  PHYSICAL EXAM: Vitals:  Today's Vitals   01/22/17 1610  BP: 130/80  Weight: 187 lb (84.8 kg)  Height: 5' 2.25" (1.581 m)  PainSc: 0-No pain  , Facility age limit for growth percentiles is 20 years.  General Exam: Physical Exam  Constitutional: He is oriented to person, place, and time. He appears well-developed and well-nourished. No distress.  HENT:  Head: Normocephalic and atraumatic.  Right Ear: External ear normal.  Left Ear:  External ear normal.  Nose: Nose normal.  Mouth/Throat: Oropharynx  is clear and moist. No oropharyngeal exudate.  Eyes: Pupils are equal, round, and reactive to light. Conjunctivae and EOM are normal. Right eye exhibits no discharge. Left eye exhibits no discharge. No scleral icterus.  Neck: Normal range of motion. Neck supple. No JVD present. No tracheal deviation present. No thyromegaly present.  Cardiovascular: Normal rate, regular rhythm, normal heart sounds and intact distal pulses.  Exam reveals no gallop and no friction rub.   No murmur heard. Pulmonary/Chest: Effort normal and breath sounds normal. No stridor. No respiratory distress. He has no wheezes. He has no rales. He exhibits no tenderness.  Abdominal: Soft. Bowel sounds are normal. He exhibits no distension and no mass. There is no tenderness. There is no rebound and no guarding. No hernia.  Musculoskeletal: Normal range of motion. He exhibits no edema, tenderness or deformity.  Lymphadenopathy:    He has no cervical adenopathy.  Neurological: He is alert and oriented to person, place, and time. He has normal reflexes. He displays normal reflexes. No cranial nerve deficit or sensory deficit. He exhibits normal muscle tone. Coordination normal.  Skin: Skin is warm and dry. No rash noted. He is not diaphoretic. No erythema. No pallor.  Psychiatric: He has a normal mood and affect. His behavior is normal.  Vitals reviewed.   Neurological: oriented to time, place, and person Cranial Nerves: normal  Neuromuscular:  Motor Mass: normal Tone: decreased in hands and feet Strength: decreased in hands and feet DTRs: 2+ and symmetric Overflow: mild Reflexes: no tremors noted, finger to nose without dysmetria bilaterally, performs thumb to finger exercise without difficulty, gait was normal and difficulty with tandem Sensory Exam:   Fine Touch: normal  Testing/Developmental Screens: CGI:6  DIAGNOSES:    ICD-10-CM   1. ADHD  (attention deficit hyperactivity disorder), inattentive type F90.0   2. Generalized anxiety disorder F41.1   3. Autism spectrum disorder F84.0   4. Medication management Z79.899   5. Coordination of complex care Z71.89   6. Patient counseled Z71.9   7. Counseling on health promotion and disease prevention Z71.89   8. Dietary counseling Z71.3   9. Exercise counseling Z71.82     RECOMMENDATIONS:  Patient Instructions  Continue concerta 54 mg every  Morning  prozac 20 mg daily  Clonidine 0.3 mg at bedtime  Melatonin 10 mg at bedtime Discussed medication Discussed development-has lost 2 pounds, cont to progress Discussed school progress-doing well this year Discussed progress in independence Recommend flu vaccine Discussed diet and fitness program   NEXT APPOINTMENT: Return in about 3 months (around 04/24/2017), or if symptoms worsen or fail to improve, for Medical follow up.   Nicholos Johns, NP Counseling Time: 30 Total Contact Time: 50 More than 50% of the visit involved counseling, discussing the diagnosis and management of symptoms with the patient and family

## 2017-04-25 ENCOUNTER — Ambulatory Visit: Payer: 59 | Admitting: Pediatrics

## 2017-04-25 ENCOUNTER — Encounter: Payer: Self-pay | Admitting: Pediatrics

## 2017-04-25 DIAGNOSIS — F900 Attention-deficit hyperactivity disorder, predominantly inattentive type: Secondary | ICD-10-CM

## 2017-04-25 DIAGNOSIS — Z713 Dietary counseling and surveillance: Secondary | ICD-10-CM | POA: Diagnosis not present

## 2017-04-25 DIAGNOSIS — F411 Generalized anxiety disorder: Secondary | ICD-10-CM

## 2017-04-25 DIAGNOSIS — F840 Autistic disorder: Secondary | ICD-10-CM

## 2017-04-25 DIAGNOSIS — Z7189 Other specified counseling: Secondary | ICD-10-CM | POA: Diagnosis not present

## 2017-04-25 DIAGNOSIS — Z79899 Other long term (current) drug therapy: Secondary | ICD-10-CM

## 2017-04-25 DIAGNOSIS — Z719 Counseling, unspecified: Secondary | ICD-10-CM

## 2017-04-25 MED ORDER — CLONIDINE HCL 0.3 MG PO TABS: 0.3000 mg | ORAL_TABLET | Freq: Every day | ORAL | 2 refills | Status: DC

## 2017-04-25 MED ORDER — FLUOXETINE HCL 20 MG PO TABS: 20.0000 mg | ORAL_TABLET | Freq: Every day | ORAL | 2 refills | Status: DC

## 2017-04-25 MED ORDER — METHYLPHENIDATE HCL ER (OSM) 54 MG PO TBCR: 54.0000 mg | EXTENDED_RELEASE_TABLET | Freq: Every day | ORAL | 0 refills | Status: DC

## 2017-04-25 NOTE — Patient Instructions (Addendum)
Continue concerta 54 mg every morning  prozac 20 mg daily  Clonidine 0.3 mg at bedtime Discussed medication and dosing Discussed weight control/diet/exercise Discussed school progress-getting A/B, took 3 classes last semister

## 2017-04-25 NOTE — Progress Notes (Signed)
[facility] [address], [address] [place] [address] Dept: [phone] Dept Fax: [phone] Loc: [phone] Loc Fax: [phone]  Medical Follow-up  Patient ID: Fernando Davies KaSamuel J Stills, male  DOB: 04/18/1996, 22 y.o.  MRN: [id number]  Date of Evaluation: 04/25/17  PCP: Eliberto Ivorylark, William, MD  Accompanied by: Mother Patient Lives with: mother  HISTORY/CURRENT STATUS:  HPI  Routine 3 month visit, medication check Approved for SSI-still hasn't received his first check EDUCATION: School: [employer] Year/Grade: 2nd yr Homework Time: n/a Performance/Grades: average Services: Other: none Activities/Exercise: doing some exercises  MEDICAL HISTORY: Appetite: good-maintaining weight well  Sleep: Bedtime: varies Awakens: varies Sleep Concerns: Initiation/Maintenance/Other: sleeps well  Individual Medical History/Review of System Changes? No Review of Systems  Constitutional: Negative.  Negative for chills, diaphoresis, fever, malaise/fatigue and weight loss.  HENT: Negative.  Negative for congestion, ear discharge, ear pain, hearing loss, nosebleeds, sinus pain, sore throat and tinnitus.   Eyes: Negative.  Negative for blurred vision, double vision, photophobia, pain, discharge and redness.  Respiratory: Negative.  Negative for cough, hemoptysis, sputum production, shortness of breath, wheezing and stridor.   Cardiovascular: Negative.  Negative for chest pain, palpitations, orthopnea, claudication, leg swelling and PND.  Gastrointestinal: Negative.  Negative for abdominal pain, blood in stool, constipation, diarrhea, heartburn, melena, nausea and vomiting.  Genitourinary: Negative.  Negative for dysuria, flank pain, frequency, hematuria and urgency.  Musculoskeletal: Negative.  Negative for back pain, falls, joint pain, myalgias and neck pain.  Skin: Negative.   Negative for itching and rash.  Neurological: Negative.  Negative for dizziness, tingling, tremors, sensory change, speech change, focal weakness, seizures, loss of consciousness, weakness and headaches.  Endo/Heme/Allergies: Negative.  Negative for environmental allergies and polydipsia. Does not bruise/bleed easily.  Psychiatric/Behavioral: Negative.  Negative for depression, hallucinations, memory loss, substance abuse and suicidal ideas. The patient is not nervous/anxious and does not have insomnia.     Allergies: Patient has no known allergies.  Current Medications:  Current Outpatient Medications:  .  cloNIDine (CATAPRES) 0.3 MG tablet, Take 1 tablet (0.3 mg total) by mouth at bedtime., Disp: 30 tablet, Rfl: 2 .  FLUoxetine (PROZAC) 20 MG tablet, Take 1 tablet (20 mg total) by mouth daily., Disp: 30 tablet, Rfl: 2 .  Melatonin 10 MG TABS, Take 1 tablet by mouth daily., Disp: , Rfl:  .  methylphenidate 54 MG PO CR tablet, Take 1 tablet (54 mg total) by mouth daily with breakfast. Do not fill until 04/09/17, Disp: 30 tablet, Rfl: 0 Medication Side Effects: None  Family Medical/Social History Changes?: No  MENTAL HEALTH: Mental Health Issues: Anxiety and fair to poor social skills  PHYSICAL EXAM: Vitals:  Today's Vitals   04/25/17 1525  PainSc: 0-No pain  , No height and weight on file for this encounter.  General Exam: Physical Exam  Constitutional: He is oriented to person, place, and time. He appears well-developed and well-nourished. No distress.  HENT:  Head: Normocephalic and atraumatic.  Right Ear: External ear normal.  Left Ear: External ear normal.  Nose: Nose normal.  Mouth/Throat: Oropharynx is clear and moist. No oropharyngeal exudate.  Eyes: Conjunctivae and EOM are normal. Pupils are equal, round, and reactive to light. Right eye exhibits no discharge. Left eye exhibits no discharge. No scleral icterus.  Neck: Normal range of motion. Neck supple. No JVD present.  No tracheal deviation present. No thyromegaly present.  Cardiovascular: Normal rate, regular rhythm, normal  heart sounds and intact distal pulses. Exam reveals no gallop and no friction rub.  No murmur heard. Pulmonary/Chest: Effort normal and breath sounds normal. No stridor. No respiratory distress. He has no wheezes. He has no rales. He exhibits no tenderness.  Abdominal: Soft. Bowel sounds are normal. He exhibits no distension and no mass. There is no tenderness. There is no rebound and no guarding. No hernia.  Musculoskeletal: Normal range of motion. He exhibits no edema or tenderness.  Lymphadenopathy:    He has no cervical adenopathy.  Neurological: He is alert and oriented to person, place, and time. He has normal reflexes. He displays normal reflexes. No cranial nerve deficit or sensory deficit. He exhibits normal muscle tone. Coordination normal.  Skin: Skin is warm and dry. No rash noted. He is not diaphoretic. No erythema. No pallor.  Psychiatric: He has a normal mood and affect. His behavior is normal.  Vitals reviewed. poor thought and judgement skills  Neurological: oriented to time, place, and person Cranial Nerves: normal  Neuromuscular:  Motor Mass: normal Tone: normal Strength: normal DTRs: 2+ and symmetric Overflow: mild Reflexes: no tremors noted, finger to nose without dysmetria bilaterally, performs thumb to finger exercise without difficulty, gait was normal and difficulty with tandem Sensory Exam: normal  Fine Touch: normal  Testing/Developmental Screens: CGI:5  DIAGNOSES:    ICD-10-CM   1. ADHD (attention deficit hyperactivity disorder), inattentive type F90.0   2. Generalized anxiety disorder F41.1   3. Autism spectrum disorder F84.0   4. Medication management Z79.899   5. Coordination of complex care Z71.89   6. Patient counseled Z71.9   7. Dietary counseling Z71.3     RECOMMENDATIONS:  Patient Instructions  Continue concerta 54 mg every  morning  prozac 20 mg daily  Clonidine 0.3 mg at bedtime Discussed medication and dosing Discussed weight control/diet/exercise Discussed school progress-getting A/B, took 3 classes last semister    NEXT APPOINTMENT: Return in about 2 months (around 07/09/2017), or if symptoms worsen or fail to improve, for Medical follow up.   Nicholos Johns, NP Counseling Time: 30 Total Contact Time: 40 More than 50% of the visit involved counseling, discussing the diagnosis and management of symptoms with the patient and family

## 2017-06-03 ENCOUNTER — Other Ambulatory Visit: Payer: Self-pay | Admitting: Pediatrics

## 2017-06-03 MED ORDER — METHYLPHENIDATE HCL ER (OSM) 54 MG PO TBCR: 54.0000 mg | EXTENDED_RELEASE_TABLET | Freq: Every day | ORAL | 0 refills | Status: DC

## 2017-06-03 NOTE — Telephone Encounter (Signed)
E-Pres[facility] Concerta 54  [facility] to [facility] [address]

## 2017-06-03 NOTE — Telephone Encounter (Signed)
Mom called for refill for Concerta.  Patient last seen 04/25/17, next appointment 07/09/17.  [facility] to [facility] [address]

## 2017-07-01 ENCOUNTER — Other Ambulatory Visit: Payer: Self-pay | Admitting: Pediatrics

## 2017-07-01 MED ORDER — METHYLPHENIDATE HCL ER (OSM) 54 MG PO TBCR: 54.0000 mg | EXTENDED_RELEASE_TABLET | Freq: Every day | ORAL | 0 refills | Status: DC

## 2017-07-01 NOTE — Telephone Encounter (Signed)
Mom called for refill for Concerta.  Patient last seen 04/25/17, next appointment 07/09/17.  [facility] [address].

## 2017-07-01 NOTE — Telephone Encounter (Signed)
E-Prescribed Concerta 54 mg directly to  [facility] [id number] [name],  - [address] AT [facility] OF [facility] [address] [place] [address] Phone: [phone] Fax: [phone]

## 2017-07-09 ENCOUNTER — Ambulatory Visit: Payer: 59 | Admitting: Pediatrics

## 2017-07-09 ENCOUNTER — Encounter: Payer: Self-pay | Admitting: Pediatrics

## 2017-07-09 VITALS — BP 130/80 | Ht 62.25 in | Wt 189.4 lb

## 2017-07-09 DIAGNOSIS — Z7182 Exercise counseling: Secondary | ICD-10-CM | POA: Diagnosis not present

## 2017-07-09 DIAGNOSIS — Z719 Counseling, unspecified: Secondary | ICD-10-CM

## 2017-07-09 DIAGNOSIS — F840 Autistic disorder: Secondary | ICD-10-CM

## 2017-07-09 DIAGNOSIS — F900 Attention-deficit hyperactivity disorder, predominantly inattentive type: Secondary | ICD-10-CM

## 2017-07-09 DIAGNOSIS — F411 Generalized anxiety disorder: Secondary | ICD-10-CM | POA: Diagnosis not present

## 2017-07-09 DIAGNOSIS — Z79899 Other long term (current) drug therapy: Secondary | ICD-10-CM

## 2017-07-09 DIAGNOSIS — Z7189 Other specified counseling: Secondary | ICD-10-CM | POA: Diagnosis not present

## 2017-07-09 MED ORDER — FLUOXETINE HCL 20 MG PO TABS: 20.0000 mg | ORAL_TABLET | Freq: Every day | ORAL | 2 refills | Status: DC

## 2017-07-09 MED ORDER — CLONIDINE HCL 0.3 MG PO TABS: 0.3000 mg | ORAL_TABLET | Freq: Every day | ORAL | 2 refills | Status: DC

## 2017-07-09 MED ORDER — METHYLPHENIDATE HCL ER (OSM) 54 MG PO TBCR: 54.0000 mg | EXTENDED_RELEASE_TABLET | Freq: Every day | ORAL | 0 refills | Status: DC

## 2017-07-09 NOTE — Patient Instructions (Addendum)
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

## 2017-07-09 NOTE — Progress Notes (Signed)
[facility] [address] [place] [place] [address] Dept: [phone] Dept Fax: [phone] Loc: [phone] Loc Fax: [phone]  Medical Follow-up  Patient ID: Fernando Davies, male  DOB: 03-26-1996, 22 y.o.  MRN: [id number]  Date of Evaluation: 07/09/17  PCP: Eliberto Ivory, MD  Accompanied by: Mother Patient Lives with: mother  HISTORY/CURRENT STATUS:  HPI  Routine 3 month visit, medication check EDUCATION: School: [employer] Year/Grade: second year Homework Time: n/a Performance/Grades: average Services: Other: none Activities/Exercise: some exercising  MEDICAL HISTORY: Appetite: good-eating healthier, maintaining well  Sleep: Bedtime: varies Awakens: varies Sleep Concerns: Initiation/Maintenance/Other: sleeps well  Individual Medical History/Review of System Changes? No Review of Systems  Constitutional: Negative.  Negative for chills, diaphoresis, fever, malaise/fatigue and weight loss.  HENT: Negative.  Negative for congestion, ear discharge, ear pain, hearing loss, nosebleeds, sinus pain, sore throat and tinnitus.   Eyes: Negative.  Negative for blurred vision, double vision, photophobia, pain, discharge and redness.  Respiratory: Negative.  Negative for cough, hemoptysis, sputum production, shortness of breath, wheezing and stridor.   Cardiovascular: Negative.  Negative for chest pain, palpitations, orthopnea, claudication, leg swelling and PND.  Gastrointestinal: Negative.  Negative for abdominal pain, blood in stool, constipation, diarrhea, heartburn, melena, nausea and vomiting.  Genitourinary: Negative.  Negative for dysuria, flank pain, frequency, hematuria and urgency.  Musculoskeletal: Negative.  Negative for back pain, falls, joint pain, myalgias and neck pain.  Skin: Negative.  Negative for itching and rash.  Neurological:  Negative.  Negative for dizziness, tingling, tremors, sensory change, speech change, focal weakness, seizures, loss of consciousness, weakness and headaches.  Endo/Heme/Allergies: Negative.  Negative for environmental allergies and polydipsia. Does not bruise/bleed easily.  Psychiatric/Behavioral: Negative.  Negative for depression, hallucinations, memory loss, substance abuse and suicidal ideas. The patient is not nervous/anxious and does not have insomnia.     Allergies: Patient has no known allergies.  Current Medications:  Current Outpatient Medications:  .  cloNIDine (CATAPRES) 0.3 MG tablet, Take 1 tablet (0.3 mg total) by mouth at bedtime., Disp: 30 tablet, Rfl: 2 .  FLUoxetine (PROZAC) 20 MG tablet, Take 1 tablet (20 mg total) by mouth daily., Disp: 30 tablet, Rfl: 2 .  Melatonin 10 MG TABS, Take 1 tablet by mouth daily., Disp: , Rfl:  .  methylphenidate 54 MG PO CR tablet, Take 1 tablet (54 mg total) by mouth daily with breakfast., Disp: 30 tablet, Rfl: 0 Medication Side Effects: None  Family Medical/Social History Changes?: No  MENTAL HEALTH: Mental Health Issues: Anxiety and poor social skills  PHYSICAL EXAM: Vitals:  Today's Vitals   07/09/17 1724  BP: 130/80  Weight: 189 lb 6.4 oz (85.9 kg)  Height: 5' 2.25" (1.581 m)  PainSc: 0-No pain  , Facility age limit for growth percentiles is 20 years.  General Exam: Physical Exam  Constitutional: He is oriented to person, place, and time. He appears well-developed and well-nourished. No distress.  HENT:  Head: Normocephalic and atraumatic.  Right Ear: External ear normal.  Left Ear: External ear normal.  Nose: Nose normal.  Mouth/Throat: Oropharynx is clear and moist. No oropharyngeal exudate.  Eyes: Conjunctivae and EOM are normal. Pupils are equal, round, and reactive to light. Right eye exhibits no discharge. Left eye exhibits no discharge. No scleral icterus.  Neck: Normal range of motion. Neck supple. No JVD present.  No tracheal deviation present. No thyromegaly present.  Cardiovascular: Normal rate, regular  rhythm, normal heart sounds and intact distal pulses. Exam reveals no gallop and no friction rub.  No murmur heard. Pulmonary/Chest: Effort normal and breath sounds normal. No stridor. No respiratory distress. He has no wheezes. He has no rales. He exhibits no tenderness.  Abdominal: Soft. Bowel sounds are normal. He exhibits no distension and no mass. There is no tenderness. There is no rebound and no guarding. No hernia.  Musculoskeletal: Normal range of motion. He exhibits no edema, tenderness or deformity.  Lymphadenopathy:    He has no cervical adenopathy.  Neurological: He is alert and oriented to person, place, and time. He has normal reflexes. He displays normal reflexes. No cranial nerve deficit or sensory deficit. He exhibits normal muscle tone. Coordination normal.  Skin: Skin is warm and dry. No rash noted. He is not diaphoretic. No erythema. No pallor.  Vitals reviewed.   Neurological: oriented to time, place, and person Cranial Nerves: normal  Neuromuscular:  Motor Mass: normal Tone: normal Strength: normal DTRs: 2+ and symmetric Overflow: mild Reflexes: no tremors noted, finger to nose without dysmetria bilaterally, performs thumb to finger exercise without difficulty, gait was normal and difficulty with tandem Sensory Exam: normal  Fine Touch: normal  Testing/Developmental Screens:  AS/RS 3/3  DIAGNOSES:    ICD-10-CM   1. ADHD (attention deficit hyperactivity disorder), inattentive type F90.0   2. Generalized anxiety disorder F41.1   3. Autism spectrum disorder F84.0   4. Medication management Z79.899   5. Coordination of complex care Z71.89   6. Patient counseled Z71.9   7. 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   Nicholos JohnsJoyce P Eduarda Scrivens, NP Counseling Time: 30 Total Contact Time: 40 More than 50% of the visit involved counseling, discussing the diagnosis and management of symptoms with the patient and family

## 2017-07-27 ENCOUNTER — Other Ambulatory Visit: Payer: Self-pay | Admitting: Pediatrics

## 2017-09-06 ENCOUNTER — Other Ambulatory Visit: Payer: Self-pay

## 2017-09-06 MED ORDER — METHYLPHENIDATE HCL ER (OSM) 54 MG PO TBCR: 54.0000 mg | EXTENDED_RELEASE_TABLET | Freq: Every day | ORAL | 0 refills | Status: DC

## 2017-09-06 NOTE — Telephone Encounter (Signed)
E-Prescribed Concerta 54 directly to  [facility] [id number] [name], [place] - [address] AT [facility] OF [facility] & [address] [place] [address] Phone: [phone] Fax: [phone]

## 2017-09-06 NOTE — Telephone Encounter (Signed)
Mom called in for refill for Methylphenidate. Last visit 07/09/2017 next visit 10/21/2017. Please escribe to [facility] on [address]

## 2017-10-07 ENCOUNTER — Other Ambulatory Visit: Payer: Self-pay

## 2017-10-07 MED ORDER — METHYLPHENIDATE HCL ER (OSM) 54 MG PO TBCR: 54.0000 mg | EXTENDED_RELEASE_TABLET | Freq: Every day | ORAL | 0 refills | Status: DC

## 2017-10-07 NOTE — Telephone Encounter (Signed)
E-Prescribed Concerta 54 directly to  [facility] [id number] - [place] - [address] [facility] OF [address] & [address] [place] [address] Phone: [phone] Fax: [phone]

## 2017-10-07 NOTE — Telephone Encounter (Signed)
Mom called in for refill for Methylphenidate. Last visit 07/09/2017 next visit 10/21/2017. [facility] [place]

## 2017-10-21 ENCOUNTER — Ambulatory Visit: Payer: 59 | Admitting: Pediatrics

## 2017-10-21 ENCOUNTER — Encounter: Payer: Self-pay | Admitting: Pediatrics

## 2017-10-21 VITALS — BP 130/80 | Ht 62.25 in | Wt 193.0 lb

## 2017-10-21 DIAGNOSIS — F900 Attention-deficit hyperactivity disorder, predominantly inattentive type: Secondary | ICD-10-CM

## 2017-10-21 DIAGNOSIS — Z713 Dietary counseling and surveillance: Secondary | ICD-10-CM

## 2017-10-21 DIAGNOSIS — Z79899 Other long term (current) drug therapy: Secondary | ICD-10-CM | POA: Diagnosis not present

## 2017-10-21 DIAGNOSIS — Z7189 Other specified counseling: Secondary | ICD-10-CM

## 2017-10-21 DIAGNOSIS — Z719 Counseling, unspecified: Secondary | ICD-10-CM

## 2017-10-21 DIAGNOSIS — F411 Generalized anxiety disorder: Secondary | ICD-10-CM

## 2017-10-21 DIAGNOSIS — Z7182 Exercise counseling: Secondary | ICD-10-CM

## 2017-10-21 DIAGNOSIS — F840 Autistic disorder: Secondary | ICD-10-CM

## 2017-10-21 MED ORDER — METHYLPHENIDATE HCL ER (OSM) 54 MG PO TBCR: 54.0000 mg | EXTENDED_RELEASE_TABLET | Freq: Every day | ORAL | 0 refills | Status: DC

## 2017-10-21 MED ORDER — CLONIDINE HCL 0.3 MG PO TABS: 0.3000 mg | ORAL_TABLET | Freq: Every day | ORAL | 2 refills | Status: DC

## 2017-10-21 MED ORDER — FLUOXETINE HCL 20 MG PO TABS: 20.0000 mg | ORAL_TABLET | Freq: Every day | ORAL | 2 refills | Status: DC

## 2017-10-21 NOTE — Patient Instructions (Addendum)
Continue concerta 54 mg every morning  prozac 20 mg daily  Clonidine 0.3 mg at bedtime Discussed medication and dosing Discussed growth and development-fair maintenance Discussed school progress-no classes this summer, working on a play Discussed transfer care to either PCP or Dawn due to age Discussed summer safety

## 2017-10-21 NOTE — Progress Notes (Signed)
[facility] [address], [address] [place] [address] Dept: [phone] Dept Fax: [phone] Loc: [phone] Loc Fax: [phone]  Medical Follow-up  Patient ID: Fernando Davies, male  DOB: 12/31/1995, 22 y.o.  MRN: [id number]  Date of Evaluation: 10/21/17  PCP: Eliberto Ivorylark, William, MD  Accompanied by: Mother Patient Lives with: mother  HISTORY/CURRENT STATUS:  HPI  Routine 3 month visit, medication check Off school for the summer [place] for birthday To go with aunt to beach Time with family  EDUCATION: School: [employer] Year/Grade: 2nd yr  Performance/Grades: average Services: Other: none Activities/Exercise: some exercise  MEDICAL HISTORY: Appetite: good MVI/Other: none Fruits/Vegs:fair Calcium: some milk, cheese Iron:likes meats  Sleep: Bedtime: varies Awakens: varies Sleep Concerns: Initiation/Maintenance/Other: good  Individual Medical History/Review of System Changes? No Review of Systems  Constitutional: Negative.  Negative for chills, diaphoresis, fever, malaise/fatigue and weight loss.  HENT: Negative.  Negative for congestion, ear discharge, ear pain, hearing loss, nosebleeds, sinus pain, sore throat and tinnitus.   Eyes: Negative.  Negative for blurred vision, double vision, photophobia, pain, discharge and redness.  Respiratory: Negative.  Negative for cough, hemoptysis, sputum production, shortness of breath, wheezing and stridor.   Cardiovascular: Negative.  Negative for chest pain, palpitations, orthopnea, claudication, leg swelling and PND.  Gastrointestinal: Negative.  Negative for abdominal pain, blood in stool, constipation, diarrhea, heartburn, melena, nausea and vomiting.  Genitourinary: Negative.  Negative for dysuria, flank pain, frequency, hematuria and urgency.  Musculoskeletal: Negative.  Negative for back  pain, falls, joint pain, myalgias and neck pain.  Skin: Negative.  Negative for itching and rash.  Neurological: Negative.  Negative for dizziness, tingling, tremors, sensory change, speech change, focal weakness, seizures, loss of consciousness, weakness and headaches.  Endo/Heme/Allergies: Negative.  Negative for environmental allergies and polydipsia. Does not bruise/bleed easily.  Psychiatric/Behavioral: Negative.  Negative for depression, hallucinations, memory loss, substance abuse and suicidal ideas. The patient is not nervous/anxious and does not have insomnia.     Allergies: Patient has no known allergies.  Current Medications:  Current Outpatient Medications:  .  cloNIDine (CATAPRES) 0.3 MG tablet, Take 1 tablet (0.3 mg total) by mouth at bedtime., Disp: 30 tablet, Rfl: 2 .  FLUoxetine (PROZAC) 20 MG tablet, Take 1 tablet (20 mg total) by mouth daily., Disp: 30 tablet, Rfl: 2 .  Melatonin 10 MG TABS, Take 1 tablet by mouth daily., Disp: , Rfl:  .  methylphenidate 54 MG PO CR tablet, Take 1 tablet (54 mg total) by mouth daily with breakfast., Disp: 30 tablet, Rfl: 0 Medication Side Effects: None  Family Medical/Social History Changes?: No  MENTAL HEALTH: Mental Health Issues: Anxiety and poor social skills, anxious behaviors  PHYSICAL EXAM: Vitals:  Today's Vitals   10/21/17 1610  BP: 130/80  Weight: 193 lb (87.5 kg)  Height: 5' 2.25" (1.581 m)  PainSc: 0-No pain  , Facility age limit for growth percentiles is 20 years.  General Exam: Physical Exam  Constitutional: He is oriented to person, place, and time. He appears well-developed and well-nourished. No distress.  HENT:  Head: Normocephalic and atraumatic.  Right Ear: External ear normal.  Left Ear: External ear normal.  Nose: Nose normal.  Mouth/Throat: Oropharynx is clear and moist. No oropharyngeal exudate.  Eyes: Pupils are equal, round, and reactive to light. Conjunctivae and EOM are normal. Right eye exhibits  no discharge. Left eye exhibits no discharge. No scleral icterus.  Neck: Normal range of motion. Neck supple. No JVD present. No tracheal deviation present. No thyromegaly present.  Cardiovascular: Normal rate, regular rhythm, normal heart sounds and intact distal pulses. Exam reveals no gallop and no friction rub.  No murmur heard. Pulmonary/Chest: Effort normal and breath sounds normal. No stridor. No respiratory distress. He has no wheezes. He has no rales. He exhibits no tenderness.  Abdominal: Soft. Bowel sounds are normal. He exhibits no distension and no mass. There is no tenderness. There is no rebound and no guarding. No hernia.  Musculoskeletal: Normal range of motion. He exhibits no edema or tenderness.  Lymphadenopathy:    He has no cervical adenopathy.  Neurological: He is alert and oriented to person, place, and time. He has normal reflexes. He displays normal reflexes. No cranial nerve deficit or sensory deficit. He exhibits normal muscle tone. Coordination normal.  Skin: Skin is warm and dry. No rash noted. He is not diaphoretic. No erythema. No pallor.  Psychiatric: He has a normal mood and affect. His behavior is normal. Judgment and thought content normal.  Vitals reviewed.   Neurological: oriented to time, place, and person Cranial Nerves: normal  Neuromuscular:  Motor Mass: normal Tone: normal Strength: normal DTRs: normal 2+ and symmetric Overflow: mild Reflexes: no tremors noted, finger to nose without dysmetria bilaterally, performs thumb to finger exercise without difficulty, gait was normal, difficulty with tandem and no ataxic movements noted Sensory Exam: normal  Fine Touch: normal  Testing/Developmental Screens:  AS/RS 4/4  DIAGNOSES:    ICD-10-CM   1. ADHD (attention deficit hyperactivity disorder), inattentive type F90.0   2. Generalized anxiety disorder F41.1   3. Autism spectrum disorder F84.0   4. Medication management Z79.899   5. Coordination of  complex care Z71.89   6. Patient counseled Z71.9   7. Exercise counseling Z71.82   8. Dietary counseling Z71.3     RECOMMENDATIONS:  Patient Instructions  Continue concerta 54 mg every morning  prozac 20 mg daily  Clonidine 0.3 mg at bedtime Discussed medication and dosing Discussed growth and development-fair maintenance Discussed school progress-no classes this summer, working on a play Discussed transfer care to either PCP or Dawn due to age Discussed summer safety discussed mild elevation in B/P and need for PCP eval  NEXT APPOINTMENT: Return in about 3 months (around 01/29/2018), or if symptoms worsen or fail to improve, for Medical follow up.   Nicholos Johns, NP Counseling Time: 30 Total Contact Time: 40 More than 50% of the visit involved counseling, discussing the diagnosis and management of symptoms with the patient and family

## 2017-11-06 ENCOUNTER — Telehealth: Payer: Self-pay

## 2017-11-06 NOTE — Telephone Encounter (Signed)
Outcome  Approvedtoday  Your PA request has been approved. Additional information will be provided in the approval communication. (Message 1145)

## 2017-11-06 NOTE — Telephone Encounter (Addendum)
Pharm faxed in Prior Auth For Concerta. Last visit 10/21/2017 next visit 01/20/2018. Submitting Prior Auth to [employer]

## 2017-11-26 ENCOUNTER — Other Ambulatory Visit: Payer: Self-pay

## 2017-11-26 MED ORDER — METHYLPHENIDATE HCL ER (OSM) 54 MG PO TBCR
54.0000 mg | EXTENDED_RELEASE_TABLET | Freq: Every day | ORAL | 0 refills | Status: DC
Start: 2017-11-26 — End: 2017-12-30

## 2017-11-26 NOTE — Telephone Encounter (Signed)
RX for above e-scribed and sent to pharmacy on record  [id number] - [place], [place] - [address] AT [address] OF [address] [place] [address] Phone: [phone] Fax: [phone]  

## 2017-11-26 NOTE — Telephone Encounter (Signed)
Mom called in for refill for Methylphenidate. Last visit 10/21/2017 next visit 01/20/2018. [facility] [address] 

## 2017-12-30 ENCOUNTER — Other Ambulatory Visit: Payer: Self-pay

## 2017-12-30 MED ORDER — METHYLPHENIDATE HCL ER (OSM) 54 MG PO TBCR
54.0000 mg | EXTENDED_RELEASE_TABLET | Freq: Every day | ORAL | 0 refills | Status: DC
Start: 2017-12-30 — End: 2018-01-20

## 2017-12-30 NOTE — Telephone Encounter (Signed)
E-Prescribed Concerta 54 mg directly to  [id number] [name], [place] - [address] AT [facility] OF [facility] & [address] [place] [address] Phone: [phone] Fax: [phone]

## 2017-12-30 NOTE — Telephone Encounter (Signed)
Mom called in for refill for Methylphenidate. Last visit 10/21/2017 next visit 01/20/2018. Please escribe to [facility] [address]

## 2018-01-20 ENCOUNTER — Encounter: Payer: Self-pay | Admitting: Family

## 2018-01-20 ENCOUNTER — Ambulatory Visit: Payer: 59 | Admitting: Family

## 2018-01-20 VITALS — BP 108/68 | HR 76 | Resp 16 | Ht 62.25 in | Wt 194.0 lb

## 2018-01-20 DIAGNOSIS — Z719 Counseling, unspecified: Secondary | ICD-10-CM

## 2018-01-20 DIAGNOSIS — F411 Generalized anxiety disorder: Secondary | ICD-10-CM

## 2018-01-20 DIAGNOSIS — F900 Attention-deficit hyperactivity disorder, predominantly inattentive type: Secondary | ICD-10-CM

## 2018-01-20 DIAGNOSIS — F840 Autistic disorder: Secondary | ICD-10-CM | POA: Diagnosis not present

## 2018-01-20 DIAGNOSIS — Z713 Dietary counseling and surveillance: Secondary | ICD-10-CM

## 2018-01-20 DIAGNOSIS — Z7182 Exercise counseling: Secondary | ICD-10-CM | POA: Diagnosis not present

## 2018-01-20 DIAGNOSIS — Z7189 Other specified counseling: Secondary | ICD-10-CM

## 2018-01-20 DIAGNOSIS — Z79899 Other long term (current) drug therapy: Secondary | ICD-10-CM

## 2018-01-20 MED ORDER — METHYLPHENIDATE HCL ER (OSM) 54 MG PO TBCR: 54.0000 mg | EXTENDED_RELEASE_TABLET | Freq: Every day | ORAL | 0 refills | Status: DC

## 2018-01-20 MED ORDER — CLONIDINE HCL 0.3 MG PO TABS: 0.3000 mg | ORAL_TABLET | Freq: Every day | ORAL | 2 refills | Status: DC

## 2018-01-20 MED ORDER — FLUOXETINE HCL 20 MG PO TABS: 20.0000 mg | ORAL_TABLET | Freq: Every day | ORAL | 2 refills | Status: DC

## 2018-01-20 NOTE — Progress Notes (Signed)
DEVELOPMENTAL AND PSYCHOLOGICAL CENTER [place] [facility] [address] [place] [address] Dept: [phone] Dept Fax: [phone] Loc: [phone] Loc Fax: [phone]  Medical Follow-up  Patient ID: Fernando Davies, male  DOB: 07-31-1995, 22 y.o.  MRN: [id number]  Date of Evaluation: 01/20/2018  PCP: Eliberto Ivory, MD  Accompanied by: Patient and mother Patient Lives with: mother  HISTORY/CURRENT STATUS:  HPI  Patient here for routine follow up related to ADHD, ASD, Anxiety, and medication management. Patient here with mother in the waiting room for a period of time. Patient cooperative and interactive with provider. Answering questions appropriately. No side effects with medication regimen and no side effects reported.   EDUCATION/WORK: School: [employer]  Year/Grade: 2nd year Homework Time: Online classes Performance/Grades: Average Services: Other: None now Activities/Exercise: daily-walking and occasional walks with dogs.   MEDICAL HISTORY: Appetite: Good MVI/Other: Daily MVI Fruits/Vegs:Fair amount Calcium: Some milk or cheese Iron:Some  Sleep: Bedtime: 11:00 pm Awakens: 7:00 am  Sleep Concerns: Initiation/Maintenance/Other: None on a routine basis  Individual Medical History/Review of System Changes? None    Allergies: Patient has no known allergies.  Current Medications:  Current Outpatient Medications:  .  cloNIDine (CATAPRES) 0.3 MG tablet, Take 1 tablet (0.3 mg total) by mouth at bedtime., Disp: 30 tablet, Rfl: 2 .  FLUoxetine (PROZAC) 20 MG tablet, Take 1 tablet (20 mg total) by mouth daily., Disp: 30 tablet, Rfl: 2 .  Melatonin 10 MG TABS, Take 1 tablet by mouth daily., Disp: , Rfl:  .  methylphenidate 54 MG PO CR tablet, Take 1 tablet (54 mg total) by mouth daily with breakfast., Disp: 30 tablet, Rfl: 0 Medication Side Effects: None  Family Medical/Social History  Changes?: None    MENTAL HEALTH: Mental Health Issues: Anxiety-Prozac 20 mg daily, no side effects or adverse effects reported.   PHYSICAL EXAM: Vitals:  Today's Vitals   01/20/18 1518  BP: 108/68  Pulse: 76  Resp: 16  Weight: 194 lb (88 kg)  Height: 5' 2.25" (1.581 m)  PainSc: 0-No pain  , Facility age limit for growth percentiles is 20 years.  General Exam: Physical Exam  Constitutional: He is oriented to person, place, and time. He appears well-developed and well-nourished.  HENT:  Head: Normocephalic and atraumatic.  Right Ear: External ear normal.  Left Ear: External ear normal.  Nose: Nose normal.  Mouth/Throat: Oropharynx is clear and moist.  Eyes: Pupils are equal, round, and reactive to light. Conjunctivae and EOM are normal.  Neck: Trachea normal, normal range of motion and full passive range of motion without pain. Neck supple.  Cardiovascular: Normal rate, regular rhythm, normal heart sounds and intact distal pulses.  Pulmonary/Chest: Effort normal and breath sounds normal.  Abdominal: Soft. Bowel sounds are normal.  Genitourinary:  Genitourinary Comments: Deferred  Musculoskeletal: Normal range of motion.  Neurological: He is alert and oriented to person, place, and time. He has normal reflexes.  Skin: Skin is warm, dry and intact. Capillary refill takes less than 2 seconds.  Psychiatric: He has a normal mood and affect. His behavior is normal. Judgment and thought content normal.  Vitals reviewed.  Review of Systems  All other systems reviewed and are negative.  Pateint with no concerns for toileting. Daily stool, no constipation or diarrhea. Void urine no difficulty. No enuresis.   Participate in daily oral hygiene to include brushing and flossing.  Neurological: oriented to time, place, and person Cranial Nerves:  normal  Neuromuscular:  Motor Mass: Normal  Tone: Normal  Strength: Normal  DTRs: 2+ and symmetric Overflow: None Reflexes: no tremors  noted Sensory Exam: Vibratory: Intact  Fine Touch: Intact  Testing/Developmental Screens: 5/30 scored by mother and counseled at today's visit.   DIAGNOSES:    ICD-10-CM   1. ADHD (attention deficit hyperactivity disorder), inattentive type F90.0 cloNIDine (CATAPRES) 0.3 MG tablet    methylphenidate 54 MG PO CR tablet  2. Generalized anxiety disorder F41.1 FLUoxetine (PROZAC) 20 MG tablet  3. Autism spectrum disorder F84.0   4. Dietary counseling Z71.3   5. Exercise counseling Z71.82   6. Medication management Z79.899   7. Coordination of complex care Z71.89   8. Patient counseled Z71.9     RECOMMENDATIONS: 3 month follow up and continuation of medication. Patient to continue with Prozac 20 mg daily, # 30 with 2 RF's, Concerta 54 mg daily, # 30 with no refills, and Clonidine 0.3 mg daily, # 30 with 2 RF's. RX for above e-scribed and sent to pharmacy on record  [name] - [address] AT [facility] OF [facility] [address] [place] [address] Phone: [phone] Fax: [phone]  Counseling at this visit included the review of old records and/or current chart with the patient with no changes since last visit.   Discussed recent history and today's examination with patient with no changes on examination.   Counseled regarding school and 2 more semesters for film making along with trying to look at 4 year schools.   Recommended a high protein, low sugar diet for ADHD patients, watch portion sizes, avoid second helpings, avoid sugary snacks and drinks, drink more water, eat more fruits and vegetables, increase daily exercise.  Discussed school academic and behavioral progress and advocated for appropriate accommodations as needed for academic support.   Maintain Structure, routine, organization, reward, motivation and consequences at home setting.   Counseled medication administration, effects, and possible side  effects with current medication regimen.   Advised importance of:  Good sleep hygiene (8- 10 hours per night) Limited screen time (none on school nights, no more than 2 hours on weekends) Regular exercise(outside and active play) Healthy eating (drink water, no sodas/sweet tea, limit portions and no seconds).   Directed mother and patient to find new PCP for adult care, MVI daily, dentist and orthodontist, more physical activity, good eating habits, and good sleep habits.   NEXT APPOINTMENT: Return in about 3 months (around 04/21/2018) for follow up visit.  More than 50% of the appointment was spent counseling and discussing diagnosis and management of symptoms with the patient and family.  Carron Curie, NP Counseling Time: 30 mins Total Contact Time: 40 mins

## 2018-03-04 ENCOUNTER — Other Ambulatory Visit: Payer: Self-pay

## 2018-03-04 DIAGNOSIS — F900 Attention-deficit hyperactivity disorder, predominantly inattentive type: Secondary | ICD-10-CM

## 2018-03-04 MED ORDER — METHYLPHENIDATE HCL ER (OSM) 54 MG PO TBCR: 54.0000 mg | EXTENDED_RELEASE_TABLET | Freq: Every day | ORAL | 0 refills | Status: DC

## 2018-03-04 NOTE — Telephone Encounter (Signed)
Mom called in for refill for Methylphenidate. Last visit9/30/2019 next visit12/30/2019. [facility] [address]

## 2018-03-04 NOTE — Telephone Encounter (Signed)
RX for above e-scribed and sent to pharmacy on record ? ?[id number] - [place], [place] - [address] AT [address] OF [address] ?[address] ?[place] [address] ?Phone: [phone] Fax: [phone] ? ? ?

## 2018-03-31 ENCOUNTER — Other Ambulatory Visit: Payer: Self-pay

## 2018-03-31 DIAGNOSIS — F900 Attention-deficit hyperactivity disorder, predominantly inattentive type: Secondary | ICD-10-CM

## 2018-03-31 MED ORDER — METHYLPHENIDATE HCL ER (OSM) 54 MG PO TBCR: 54.0000 mg | EXTENDED_RELEASE_TABLET | Freq: Every day | ORAL | 0 refills | Status: DC

## 2018-03-31 NOTE — Telephone Encounter (Signed)
Mom called in for refill for Methylphenidate. Last visit9/30/2019 next visit12/30/2019. [facility] 

## 2018-03-31 NOTE — Telephone Encounter (Signed)
E-Prescribed Concerta 54 directly to  [id number] - [place], [place] - [address] AT [address] OF [address] [place] [address] Phone: [phone] Fax: [phone]  

## 2018-04-18 ENCOUNTER — Other Ambulatory Visit: Payer: Self-pay | Admitting: Family

## 2018-04-18 DIAGNOSIS — F411 Generalized anxiety disorder: Secondary | ICD-10-CM

## 2018-04-18 DIAGNOSIS — F900 Attention-deficit hyperactivity disorder, predominantly inattentive type: Secondary | ICD-10-CM

## 2018-04-18 NOTE — Telephone Encounter (Signed)
Prozac 20 mg daily, # 30 with 2 RF's and Clonidine 0.3 mg at HS # 30 with 2 RF's. RX for above e-scribed and sent to pharmacy on record  [name]- [place], [place] - [address] AT [facility] OF [facility] [address] [place] [address] Phone: [phone] Fax: [phone]

## 2018-04-21 ENCOUNTER — Ambulatory Visit: Payer: 59 | Admitting: Family

## 2018-04-21 ENCOUNTER — Encounter: Payer: Self-pay | Admitting: Family

## 2018-04-21 VITALS — BP 112/68 | HR 72 | Resp 16 | Ht 62.25 in | Wt 192.6 lb

## 2018-04-21 DIAGNOSIS — Z7189 Other specified counseling: Secondary | ICD-10-CM

## 2018-04-21 DIAGNOSIS — F840 Autistic disorder: Secondary | ICD-10-CM | POA: Diagnosis not present

## 2018-04-21 DIAGNOSIS — F900 Attention-deficit hyperactivity disorder, predominantly inattentive type: Secondary | ICD-10-CM

## 2018-04-21 DIAGNOSIS — F411 Generalized anxiety disorder: Secondary | ICD-10-CM

## 2018-04-21 DIAGNOSIS — Z719 Counseling, unspecified: Secondary | ICD-10-CM

## 2018-04-21 DIAGNOSIS — Z7182 Exercise counseling: Secondary | ICD-10-CM

## 2018-04-21 DIAGNOSIS — Z79899 Other long term (current) drug therapy: Secondary | ICD-10-CM

## 2018-04-21 MED ORDER — METHYLPHENIDATE HCL ER (OSM) 54 MG PO TBCR: 54.0000 mg | EXTENDED_RELEASE_TABLET | Freq: Every day | ORAL | 0 refills | Status: DC

## 2018-04-21 NOTE — Progress Notes (Signed)
Patient ID: Fernando Davies, male   DOB: 06/19/1995, 22 y.o.   MRN: [id number] Medication Check  Patient ID: Fernando KaSamuel J Ossa  DOB: [birth year]  MRN: [id number]  DATE:04/21/18 Eliberto Ivorylark, William, MD  Accompanied by: self and mother in the waiting room Patient Lives with: mother  HISTORY/CURRENT STATUS: HPI  Patient here for routine follow up related to ADHD, ASD, Anxiety, and medication management. Patient here with mother in the waiting room due to illness. Patient interactive and answering questions appropriately. Patient recently had a viral illness too with getting "better" now. 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 Has continued his medication regimen with no complaints or side effects.   EDUCATION: School: [employer]  Year/Grade: 2nd year with online classes  Only a few classes left Exercise: Swing when able to depending on certain places and walking the dog.   MEDICAL HISTORY: Appetite: Good with no problems and MVI daily   Sleep: Bedtime: 11:00 pm   Awakens: 7-8:00 am   Concerns: Initiation/Maintenance/Other: No problems, melatonin and clonidine for sleep initiation.   Individual Medical History/ Review of Systems: Changes? :Yes, recent viral illness.   Family Medical/ Social History: Changes? None recently reported  Current Medications:  Prozac, Clonidine, and Concerta Medication Side Effects: None  MENTAL HEALTH: Mental Health Issues:  Anxiety-Prozac Review of Systems  Psychiatric/Behavioral: Positive for decreased concentration. The patient is nervous/anxious.   All other systems reviewed and are negative.  No concerns for toileting. Daily stool, no constipation or diarrhea. Void urine no difficulty. No enuresis.   Participate in daily oral hygiene to include brushing and flossing.  PHYSICAL EXAM; Vitals:   04/21/18 1412  Weight: 192 lb 9.6 oz (87.4 kg)  Height: 5' 2.25" (1.581 m)   Body mass index  is 34.94 kg/m.  General Physical Exam: Unchanged from previous exam, date:01/20/18  Testing/Developmental Screens: CGI/ASRS = not completed Reviewed with patient and counseled today.   DIAGNOSES:    ICD-10-CM   1. ADHD (attention deficit hyperactivity disorder), inattentive type F90.0 methylphenidate 54 MG PO CR tablet  2. Generalized anxiety disorder F41.1   3. Autism spectrum disorder F84.0   4. Exercise counseling Z71.82   5. Medication management Z79.899   6. Patient counseled Z71.9   7. Coordination of complex care Z71.89    Patient and mother verbalized understanding of all topics discussed at the visit.   RECOMMENDATIONS: 3 month follow up and continuation of medication. Concerta 54 mg daily, # 30 with no RF's, Continue with Clonidine and Prozac with no Rx's today.  RX for above e-scribed and sent to pharmacy on record  [name]- [place], [place] - [address] AT [facility] [address] [place] [address] Phone: [phone] Fax: [phone]  Counseling at this visit included the review of old records and/or current chart with the patient with updates since last f/u visit.  Discussed recent history and today's examination with patient with no changes on exam today.   Counseled regarding school, graduations, possible continuation of school.   Recommended a high protein, low sugar diet for ADHD patients, watch portion sizes, avoid second helpings, avoid sugary snacks and drinks, drink more water, eat more fruits and vegetables, increase daily exercise.  Discussed school academic and behavioral progress and advocated for appropriate accommodations as needed for learning support. 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   Discussed importance  of maintaining structure, routine, organization, reward, motivation and consequences with consistency for home and school settings.  Counseled medication  pharmacokinetics, options, dosage, administration, desired effects, and possible side effects.    Advised importance of:  Good sleep hygiene (8- 10 hours per night, no TV or video games for 1 hour before bedtime) Limited screen time (none on school nights, no more than 2 hours/day on weekends, use of screen time for motivation) Regular exercise(outside and active play) Healthy eating (drink water or milk, no sodas/sweet tea, limit portions and no seconds).   NEXT APPOINTMENT: 3 months from today  Medical Decision-making: More than 50% of the appointment was spent counseling and discussing diagnosis and management of symptoms with the patient and family.  Counseling Time: 25 minutes Total Contact Time: 30 minutes

## 2018-05-28 ENCOUNTER — Other Ambulatory Visit: Payer: Self-pay

## 2018-05-28 DIAGNOSIS — F900 Attention-deficit hyperactivity disorder, predominantly inattentive type: Secondary | ICD-10-CM

## 2018-05-28 MED ORDER — METHYLPHENIDATE HCL ER (OSM) 54 MG PO TBCR: 54.0000 mg | EXTENDED_RELEASE_TABLET | Freq: Every day | ORAL | 0 refills | Status: DC

## 2018-05-28 NOTE — Telephone Encounter (Signed)
E-Prescribed Concerta 54 mg directly to  [id number] - [place], [place] - [address] AT [address] OF [address] [place] [address] Phone: [phone] Fax: [phone]   

## 2018-05-28 NOTE — Telephone Encounter (Signed)
Mom called in for refill for Methylphenidate. Last visit 04/21/2018 next visit3/30/2020. Please escribe to [facility] [place] [address]

## 2018-06-30 ENCOUNTER — Other Ambulatory Visit: Payer: Self-pay

## 2018-06-30 DIAGNOSIS — F900 Attention-deficit hyperactivity disorder, predominantly inattentive type: Secondary | ICD-10-CM

## 2018-06-30 MED ORDER — METHYLPHENIDATE HCL ER (OSM) 54 MG PO TBCR: 54.0000 mg | EXTENDED_RELEASE_TABLET | Freq: Every day | ORAL | 0 refills | Status: DC

## 2018-06-30 NOTE — Telephone Encounter (Signed)
Mom called in for refill for Methylphenidate. Last visit 04/21/2018 next visit3/30/2020. Please escribe to [facility] [place] [address]

## 2018-06-30 NOTE — Telephone Encounter (Signed)
E-Prescribed Concerta 54 mg directly to  [facility] #[id number] [name],  - [address] AT [facility] OF [facility] & [address] [place] [address] Phone: [phone] Fax: [phone]

## 2018-07-17 ENCOUNTER — Other Ambulatory Visit: Payer: Self-pay | Admitting: Family

## 2018-07-17 DIAGNOSIS — F411 Generalized anxiety disorder: Secondary | ICD-10-CM

## 2018-07-17 DIAGNOSIS — F900 Attention-deficit hyperactivity disorder, predominantly inattentive type: Secondary | ICD-10-CM

## 2018-07-17 NOTE — Telephone Encounter (Signed)
Clonidine 0.3 mg at HS, # 30 with 2 RF's and Prozac 20 mg daily, # 30 with 2 RF's. RX for above e-scribed and sent to pharmacy on record  [facility][id number] [name], [place] - [address] AT [facility] OF [facility] & [address] [place] [address] Phone: [phone] Fax: [phone]

## 2018-07-21 ENCOUNTER — Encounter: Payer: Self-pay | Admitting: Family

## 2018-07-21 ENCOUNTER — Other Ambulatory Visit: Payer: Self-pay

## 2018-07-21 ENCOUNTER — Ambulatory Visit (INDEPENDENT_AMBULATORY_CARE_PROVIDER_SITE_OTHER): Payer: 59 | Admitting: Family

## 2018-07-21 VITALS — Wt 189.0 lb

## 2018-07-21 DIAGNOSIS — Z719 Counseling, unspecified: Secondary | ICD-10-CM

## 2018-07-21 DIAGNOSIS — Z7182 Exercise counseling: Secondary | ICD-10-CM

## 2018-07-21 DIAGNOSIS — F840 Autistic disorder: Secondary | ICD-10-CM | POA: Diagnosis not present

## 2018-07-21 DIAGNOSIS — F411 Generalized anxiety disorder: Secondary | ICD-10-CM | POA: Diagnosis not present

## 2018-07-21 DIAGNOSIS — Z713 Dietary counseling and surveillance: Secondary | ICD-10-CM

## 2018-07-21 DIAGNOSIS — F900 Attention-deficit hyperactivity disorder, predominantly inattentive type: Secondary | ICD-10-CM | POA: Diagnosis not present

## 2018-07-21 DIAGNOSIS — Z79899 Other long term (current) drug therapy: Secondary | ICD-10-CM

## 2018-07-21 MED ORDER — METHYLPHENIDATE HCL ER (OSM) 54 MG PO TBCR: 54.0000 mg | EXTENDED_RELEASE_TABLET | Freq: Every day | ORAL | 0 refills | Status: DC

## 2018-07-21 NOTE — Progress Notes (Signed)
[place] [facility] [address] [place] [address] [place] [address] Dept: [phone] Dept Fax: [phone]  Medication Check by Phone Due to COVID-19  Patient ID:  Fernando Davies  male DOB: Oct 07, 1995   23 y.o.   MRN: [id number]   DATE:07/21/18  PCP: Eliberto Ivory, MD  Virtual Visit via Telephone Note  I interviewed:Roniel Mendel Corning patient and mother on 07/21/18 at  8:00 AM EDT by telephone and verified that I am speaking with the correct person using two identifiers.    I discussed the limitations, risks, security and privacy concerns of performing an evaluation and management service by telephone and the availability of in person appointments. I also discussed with the parent that there may be a patient responsible charge related to this service. The Parent expressed understanding and agreed to proceed.  Parent Location: at home Provider Location: [address] [place], [place]  HISTORY/CURRENT STATUS: FAIZAN EDHOLM is being followed for medication management of the psychoactive medications for ADHD and review of educational and behavioral concerns. Patient doing well with current medication regimen with no side effects.   Dyvon currently taking Concerta and Prozac, which is working well. Takes medication at  am. Medication tends to wear off around 5-6 pm. Josafat is able to focus through school work.   Boomer is eating well (eating breakfast, lunch and dinner). Having issues with not eating breakfast or lunch, eating early dinner at 5:00 pm and eating again at 10:00 pm before bedtime.   Sleeping well (goes to bed at 11:00 pm wakes at 9:00 am), sleeping through the night. Clonidine and Melatonin.   Maurie denies thoughts of hurting self or others, denies depression, anxiety, or fears. Anxiety with Prozac for symptom control.   EDUCATION: School: [employer]  Year/Grade: 2nd year with online classes, Sociology,  [place] History Performance/ Grades: Average Services: Other: Disability   Mirac is currently out of school for social distancing due to COVID-19. Online schooling now.   Activities/ Exercise: daily, walking more.   Screen time: (phone, tablet, TV, computer): more online with schooling and movie watching.   MEDICAL HISTORY: Individual Medical History/ Review of Systems: Changes? :None reported recently  Family Medical/ Social History: Changes? No  Patient Lives with: mother  Current Medications:  Current Outpatient Medications on File Prior to Visit  Medication Sig Dispense Refill  . cloNIDine (CATAPRES) 0.3 MG tablet TAKE 1 TABLET(0.3 MG) BY MOUTH AT BEDTIME 30 tablet 2  . FLUoxetine (PROZAC) 20 MG tablet TAKE 1 TABLET(20 MG) BY MOUTH DAILY 30 tablet 2  . Melatonin 10 MG TABS Take 1 tablet by mouth daily.     No current facility-administered medications on file prior to visit.     Medication Side Effects: None  MENTAL HEALTH: Mental Health Issues:   Anxiety -well controlled with Prozac with good symptom control.  DIAGNOSES:    ICD-10-CM   1. ADHD (attention deficit hyperactivity disorder), inattentive type F90.0 methylphenidate 54 MG PO CR tablet  2. Autism spectrum disorder F84.0   3. Generalized anxiety disorder F41.1   4. Dietary counseling Z71.3   5. Exercise counseling Z71.82   6. Patient counseled Z71.9   7. Medication management Z79.899     RECOMMENDATIONS:  Discussed recent history with patient & parent with updates since last f/u visit in the office.  Discussed school academic progress and appropriate accommodations as needed for at home learning. Mother helps with some learning when needed.   Discussed  continued need for routine, structure, motivation, reward and positive reinforcement with home and online schooling.   Encouraged recommended limitations on TV, tablets, phones, video games and computers for non-educational activities.   Encouraged physical  activity and outdoor play, maintaining social distancing.   Referred to [employer] for resources about engaging individuals who are at home in home and online study.    Counseled medication pharmacokinetics, options, dosage, administration, desired effects, and possible side effects.   Concerta 54 mg daily, # 30 with no RF's. RX for above e-scribed and sent to pharmacy on record  [facility] #[id number] [name], [place] - [address] AT [facility] OF [facility] & [address] [place] [address] Phone: [phone] Fax: [phone]  Prozac 20 mg daily, no Rx and Clonidine 0.3 mg at HS, no Rx. Continue with both medications.   I discussed the assessment and treatment plan with the patient & parent. The patient & parent was provided an opportunity to ask questions and all were answered. The patient & parent agreed with the plan and demonstrated an understanding of the instructions.  NEXT APPOINTMENT:  Return in about 3 months (around 10/21/2018) for follow up visit. The patient was advised to call back or seek an in-person evaluation if the symptoms worsen or if the condition fails to improve as anticipated  Medical Decision-making: More than 50% of the appointment was spent counseling and discussing diagnosis and management of symptoms with the patient and family.  I provided 25 minutes of non-face-to-face time during this encounter.  Carron Curie, NP Family Nurse Practitioner [facility]

## 2018-08-26 ENCOUNTER — Other Ambulatory Visit: Payer: Self-pay

## 2018-08-26 DIAGNOSIS — F900 Attention-deficit hyperactivity disorder, predominantly inattentive type: Secondary | ICD-10-CM

## 2018-08-26 MED ORDER — METHYLPHENIDATE HCL ER (OSM) 54 MG PO TBCR: 54.0000 mg | EXTENDED_RELEASE_TABLET | Freq: Every day | ORAL | 0 refills | Status: DC

## 2018-08-26 NOTE — Telephone Encounter (Signed)
RX for above e-scribed and sent to pharmacy on record ? ?[id number] - [place], [place] - [address] AT [address] OF [address] ?[address] ?[place] [address] ?Phone: [phone] Fax: [phone] ? ? ?

## 2018-08-26 NOTE — Telephone Encounter (Signed)
Mom called in for refill for Concerta. Last visit 07/21/2018. Please escribe to [facility] [address]

## 2018-10-01 ENCOUNTER — Encounter: Payer: Self-pay | Admitting: Family

## 2018-10-01 ENCOUNTER — Ambulatory Visit (INDEPENDENT_AMBULATORY_CARE_PROVIDER_SITE_OTHER): Payer: 59 | Admitting: Family

## 2018-10-01 DIAGNOSIS — Z7182 Exercise counseling: Secondary | ICD-10-CM | POA: Diagnosis not present

## 2018-10-01 DIAGNOSIS — Z79899 Other long term (current) drug therapy: Secondary | ICD-10-CM

## 2018-10-01 DIAGNOSIS — F840 Autistic disorder: Secondary | ICD-10-CM

## 2018-10-01 DIAGNOSIS — Z719 Counseling, unspecified: Secondary | ICD-10-CM

## 2018-10-01 DIAGNOSIS — F900 Attention-deficit hyperactivity disorder, predominantly inattentive type: Secondary | ICD-10-CM

## 2018-10-01 DIAGNOSIS — F411 Generalized anxiety disorder: Secondary | ICD-10-CM

## 2018-10-01 DIAGNOSIS — Z713 Dietary counseling and surveillance: Secondary | ICD-10-CM

## 2018-10-01 MED ORDER — CLONIDINE HCL 0.3 MG PO TABS: ORAL_TABLET | ORAL | 2 refills | Status: DC

## 2018-10-01 MED ORDER — FLUOXETINE HCL 20 MG PO TABS: ORAL_TABLET | ORAL | 2 refills | Status: DC

## 2018-10-01 MED ORDER — METHYLPHENIDATE HCL ER (OSM) 54 MG PO TBCR: 54.0000 mg | EXTENDED_RELEASE_TABLET | Freq: Every day | ORAL | 0 refills | Status: DC

## 2018-10-01 NOTE — Progress Notes (Signed)
[facility]. [address] [place] [address] Dept: [phone] Dept Fax: [phone]  Medication Check visit via Virtual Video due to COVID-19  Patient ID:  Fernando Davies  male DOB: Jan 02, 1996   23 y.o.   MRN: [id number]   DATE:10/01/18  PCP: Elnita Maxwell, MD  Virtual Visit via Video Note  I connected with  Audley Hose  and Audley Hose 's Mother (Name [name]) on 10/01/18 at  2:00 PM EDT by a video enabled telemedicine application and verified that I am speaking with the correct person using two identifiers. Patient & Parent Location: at mother's office.    I discussed the limitations, risks, security and privacy concerns of performing an evaluation and management service by telephone and the availability of in person appointments. I also discussed with the parents that there may be a patient responsible charge related to this service. The parents expressed understanding and agreed to proceed.  Provider: Carolann Littler, NP  Location: private residence  HISTORY/CURRENT STATUS: Fernando Davies is here for medication management of the psychoactive medications for ADHD and review of educational and behavioral concerns.   Noriel currently taking Concerta, Prozac and Clonidine, which is working well. Takes medication as as prescribed. Tauren is able to focus through school/homework.   Ollivander is eating well (eating breakfast, lunch and dinner). Eating with no issues recently. Healthier foods and more protein.   Sleeping well (getting enough sleep), sleeping through the night. Some melatonin.   EDUCATION: School: [employer] Year/Grade: 2nd year Only 4 classes left to take to finish his degree. Performance/Grades: 4.0 average this past semester Service: Disability Services  Fernando Davies was out of school due to social distancing due to COVID-19 and participated in a home schooling program.   Activities/ Exercise: daily-Walking in the neighborhood, some swimming recently  Screen time: (phone, tablet, TV, computer): TV, movies, computer and phone.   MEDICAL HISTORY: Individual Medical History/ Review of Systems: Changes? :None   Family Medical/ Social History: Changes? None recently. Patient Lives with: mother  Current Medications:  Outpatient Encounter Medications as of 10/01/2018  Medication Sig  . cloNIDine (CATAPRES) 0.3 MG tablet TAKE 1 TABLET(0.3 MG) BY MOUTH AT BEDTIME  . FLUoxetine (PROZAC) 20 MG tablet TAKE 1 TABLET(20 MG) BY MOUTH DAILY  . Melatonin 10 MG TABS Take 1 tablet by mouth daily.  . methylphenidate 54 MG PO CR tablet Take 1 tablet (54 mg total) by mouth daily with breakfast.  . Multiple Vitamin (MULTIVITAMIN) tablet Take 1 tablet by mouth daily.  . [DISCONTINUED] cloNIDine (CATAPRES) 0.3 MG tablet TAKE 1 TABLET(0.3 MG) BY MOUTH AT BEDTIME  . [DISCONTINUED] FLUoxetine (PROZAC) 20 MG tablet TAKE 1 TABLET(20 MG) BY MOUTH DAILY  . [DISCONTINUED] methylphenidate 54 MG PO CR tablet Take 1 tablet (54 mg total) by mouth daily with breakfast.   No facility-administered encounter medications on file as of 10/01/2018.    Medication Side Effects: None  MENTAL HEALTH: Mental Health Issues:   Anxiety-increased some in the beginning.    DIAGNOSES:    ICD-10-CM   1. ADHD (attention deficit hyperactivity disorder), inattentive type F90.0 cloNIDine (CATAPRES) 0.3 MG tablet    methylphenidate 54 MG PO CR tablet  2. Generalized anxiety disorder F41.1 FLUoxetine (PROZAC) 20 MG tablet  3. Autism spectrum disorder F84.0   4. Exercise counseling Z71.82   5. Dietary counseling Z71.3   6. Patient counseled Z71.9   7. Medication management [phone]  RECOMMENDATIONS:  Discussed recent history with patient & parent with updates since last f/u visit.  Discussed school academic progress and recommended continued summer academic home school activities using appropriate  accommodations as needed.   Referred to ADDitudemag.com for resources about engaging children who are in home schooling or home for the summer with ADHD.  Recommended summer reading program. Referred to [employer] library ([email or web address]://nckids/[email or web address])  Discussed continued need for routine, structure, and motivation with school courses.    Encouraged recommended limitations on TV, tablets, phones, video games and computers for non-educational activities.   Discussed need for bedtime routine, use of good sleep hygiene, no video games, TV or phones for an hour before bedtime.   Encouraged physical activity and outdoor play, maintaining social distancing.   Counseled medication pharmacokinetics, options, dosage, administration, desired effects, and possible side effects.   Concerta 54 mg daily, # 30 with no RF's Prozac 20 mg dose, daily, # 30 with 2 RF's Clonidine 0.3 mg at HS # 30 with 2 RF's. RX for above e-scribed and sent to pharmacy on record  [id number] [name], [place] - [address] [facility] [address] [place] [address] Phone: [phone] Fax: [phone]  I discussed the assessment and treatment plan with the patient & parent. The patient & parent was provided an opportunity to ask questions and all were answered. The patient & parent agreed with the plan and demonstrated an understanding of the instructions.   I provided 25 minutes of non-face-to-face time during this encounter.   Completed record review for 10 minutes prior to the virtual video visit.   NEXT APPOINTMENT:  Return in about 3 months (around 01/01/2019) for follow up visit.  The patient & parent was advised to call back or seek an in-person evaluation if the symptoms worsen or if the condition fails to improve as anticipated.  Medical Decision-making: More than 50% of the appointment was spent counseling and discussing diagnosis and  management of symptoms with the patient and family.  Carron Curieawn M Paretta-Leahey, NP

## 2018-11-03 ENCOUNTER — Other Ambulatory Visit: Payer: Self-pay

## 2018-11-03 DIAGNOSIS — F900 Attention-deficit hyperactivity disorder, predominantly inattentive type: Secondary | ICD-10-CM

## 2018-11-03 MED ORDER — METHYLPHENIDATE HCL ER (OSM) 54 MG PO TBCR: 54.0000 mg | EXTENDED_RELEASE_TABLET | Freq: Every day | ORAL | 0 refills | Status: DC

## 2018-11-03 NOTE — Telephone Encounter (Signed)
Mom called in for refill for Concerta. Last visit 10/01/2018. Please escribe to [facility] on [address]

## 2018-11-03 NOTE — Telephone Encounter (Signed)
E-Prescribed Concerta 54 directly to  [id number] - [place], [place] - [address] AT [address] OF [address] [place] [address] Phone: [phone] Fax: [phone]  

## 2018-11-25 ENCOUNTER — Other Ambulatory Visit: Payer: Self-pay

## 2018-11-25 DIAGNOSIS — F900 Attention-deficit hyperactivity disorder, predominantly inattentive type: Secondary | ICD-10-CM

## 2018-11-25 MED ORDER — METHYLPHENIDATE HCL ER (OSM) 54 MG PO TBCR: 54.0000 mg | EXTENDED_RELEASE_TABLET | Freq: Every day | ORAL | 0 refills | Status: DC

## 2018-11-25 NOTE — Telephone Encounter (Signed)
Concerta 54 mg daily, # 30 with no RF's.RX for above e-scribed and sent to pharmacy on record  [id number] - [place], [place] - [address] AT [address] [place]  [address] Phone: [phone] Fax: [phone]   

## 2018-11-25 NOTE — Telephone Encounter (Signed)
Mom called in for refill for Concerta. Last visit6/01/2019 next visit 01/13/2019. Please escribe to [facility] on [address] 

## 2018-12-01 ENCOUNTER — Telehealth: Payer: Self-pay

## 2018-12-01 NOTE — Telephone Encounter (Signed)
Verified home address with mom 

## 2018-12-30 ENCOUNTER — Other Ambulatory Visit: Payer: Self-pay

## 2018-12-30 DIAGNOSIS — F900 Attention-deficit hyperactivity disorder, predominantly inattentive type: Secondary | ICD-10-CM

## 2018-12-30 MED ORDER — METHYLPHENIDATE HCL ER (OSM) 54 MG PO TBCR: 54.0000 mg | EXTENDED_RELEASE_TABLET | Freq: Every day | ORAL | 0 refills | Status: DC

## 2018-12-30 NOTE — Telephone Encounter (Signed)
Mom called in for refill for Concerta. Last visit6/01/2019 next visit 01/13/2019. Please escribe to [facility] on [address]

## 2018-12-30 NOTE — Telephone Encounter (Signed)
RX for above e-scribed and sent to pharmacy on record ? ?[id number] - [place], [place] - [address] AT [address] ?[address] ?[place] [address] ?Phone: [phone] Fax: [phone] ? ? ?

## 2019-01-13 ENCOUNTER — Ambulatory Visit (INDEPENDENT_AMBULATORY_CARE_PROVIDER_SITE_OTHER): Payer: 59 | Admitting: Family

## 2019-01-13 ENCOUNTER — Encounter: Payer: Self-pay | Admitting: Family

## 2019-01-13 ENCOUNTER — Other Ambulatory Visit: Payer: Self-pay

## 2019-01-13 ENCOUNTER — Other Ambulatory Visit: Payer: Self-pay | Admitting: Family

## 2019-01-13 DIAGNOSIS — F900 Attention-deficit hyperactivity disorder, predominantly inattentive type: Secondary | ICD-10-CM

## 2019-01-13 DIAGNOSIS — F411 Generalized anxiety disorder: Secondary | ICD-10-CM

## 2019-01-13 MED ORDER — METHYLPHENIDATE HCL ER (OSM) 54 MG PO TBCR: 54.0000 mg | EXTENDED_RELEASE_TABLET | Freq: Every day | ORAL | 0 refills | Status: DC

## 2019-01-13 NOTE — Telephone Encounter (Signed)
Last visit 10/01/2018 next visit 01/13/2019

## 2019-01-13 NOTE — Telephone Encounter (Signed)
RX for above e-scribed and sent to pharmacy on record ? ?[id number] - [place], [place] - [address] AT [address] ?[address] ?[place] [address] ?Phone: [phone] Fax: [phone] ? ? ?

## 2019-01-13 NOTE — Progress Notes (Signed)
[facility] [address], [place]. [address] [place] [address] Dept: [phone] Dept Fax: [phone]  Medication Check visit via Virtual Video due to COVID-19  Patient ID:  Fernando Davies  male DOB: 05/05/1995   23 y.o.   MRN: [id number]   DATE:01/13/19  PCP: Eliberto Ivory, MD  Virtual Visit via Video Note  I connected with  Fernando Davies  and Fernando Davies 's Mother (Name [name]) on 01/13/19 at  2:00 PM EDT by a video enabled telemedicine application and verified that I am speaking with the correct person using two identifiers. Patient/Parent Location: in the car   I discussed the limitations, risks, security and privacy concerns of performing an evaluation and management service by telephone and the availability of in person appointments. I also discussed with the parents that there may be a patient responsible charge related to this service. The parents expressed understanding and agreed to proceed.  Provider: Carron Curie, NP  Location: private location  HISTORY/CURRENT STATUS: Fernando Davies is here for medication management of the psychoactive medications for ADHD and review of educational and behavioral concerns.   Fernando Davies currently taking Concerta, Prozac, and Clonidine, which is working well. Takes medication as directed. Medication tends to last for most of the day. Fernando Davies is able to focus through school/homework.   Fernando Davies is eating well (eating breakfast, lunch and dinner). Doing well with his eating.   Sleeping well (goes to bed at 11:00 pm wakes at 9:00 am), sleeping through the night.   EDUCATION: School: [employer]  Year/Grade: 2nd year and only 1 more class left to take  Performance/ Grades: average Services: Other: Disability Services  Fernando Davies is currently in distance learning due to social distancing due to COVID-19 and will continue for at least:for this semester.   Activities/  Exercise: intermittently walking around the neighborhood and swinging.   Screen time: (phone, tablet, TV, computer): computer for school, phone, TV and movies.   MEDICAL HISTORY: Individual Medical History/ Review of Systems: Changes? :None reported recently. NO routine visits.  Family Medical/ Social History: Changes? Yes, younger brother back at home. Patient Lives with: mother and brother  Current Medications:  Outpatient Encounter Medications as of 01/13/2019  Medication Sig  . cloNIDine (CATAPRES) 0.3 MG tablet TAKE 1 TABLET(0.3 MG) BY MOUTH AT BEDTIME  . FLUoxetine (PROZAC) 20 MG tablet TAKE 1 TABLET(20 MG) BY MOUTH DAILY  . Melatonin 10 MG TABS Take 1 tablet by mouth daily.  Melene Muller ON 01/29/2019] methylphenidate 54 MG PO CR tablet Take 1 tablet (54 mg total) by mouth daily with breakfast.  . Multiple Vitamin (MULTIVITAMIN) tablet Take 1 tablet by mouth daily.  . [DISCONTINUED] methylphenidate 54 MG PO CR tablet Take 1 tablet (54 mg total) by mouth daily with breakfast.   No facility-administered encounter medications on file as of 01/13/2019.    Medication Side Effects: None  MENTAL HEALTH: Mental Health Issues:   Anxiety Prozac has assisted with symptoms control. No suicidal thoughts or ideations.   DIAGNOSES:    ICD-10-CM   1. ADHD (attention deficit hyperactivity disorder), inattentive type  F90.0 methylphenidate 54 MG PO CR tablet    RECOMMENDATIONS:  Discussed recent history with patient & parent with updates for school, learning, health, and medications.   Discussed school academic progress and recommended continued accommodations for the new school year.  Referred to ADDitudemag.com for resources about using distance learning with children with ADHD for continued support.  Children and young adults with ADHD often suffer from disorganization, difficulty with time management, completing projects and other executive function difficulties.  Recommended Reading:  "Smart but Scattered" and "Smart but Scattered Teens" by Peg Renato Battles and Ethelene Browns.    Discussed continued need for structure, routine, reward (external), motivation (internal), positive reinforcement, consequences, and organization with online schooling.   Encouraged recommended limitations on TV, tablets, phones, video games and computers for non-educational activities.   Discussed need for bedtime routine, use of good sleep hygiene, no video games, TV or phones for an hour before bedtime.   Encouraged physical activity and outdoor exercise, maintaining social distancing.   Counseled medication pharmacokinetics, options, dosage, administration, desired effects, and possible side effects.   Concerta 54 mg daily, # 30 with no RF's post dated for 01/29/2019 Clonidine 0.3 mg at HS no Rx today Prozac 20 mg daily, no Rx today RX for above e-scribed and sent to pharmacy on record  [id number], [place] [address] Phone: [phone] Fax: [phone]  I discussed the assessment and treatment plan with the patient & parent. The patient & parent was provided an opportunity to ask questions and all were answered. The patient & parent agreed with the plan and demonstrated an understanding of the instructions.   I provided 25 minutes of non-face-to-face time during this encounter. Completed record review for 10 minutes prior to the virtual video visit.   NEXT APPOINTMENT:  Return in about 3 months (around 04/14/2019) for follow up in office visit.  The patient & parent was advised to call back or seek an in-person evaluation if the symptoms worsen or if the condition fails to improve as anticipated.  Medical Decision-making: More than 50% of the appointment was spent counseling and discussing diagnosis and management of symptoms with the patient and family.  Carolann Littler, NP

## 2019-03-10 ENCOUNTER — Other Ambulatory Visit: Payer: Self-pay

## 2019-03-10 DIAGNOSIS — F900 Attention-deficit hyperactivity disorder, predominantly inattentive type: Secondary | ICD-10-CM

## 2019-03-10 MED ORDER — METHYLPHENIDATE HCL ER (OSM) 54 MG PO TBCR: 54.0000 mg | EXTENDED_RELEASE_TABLET | Freq: Every day | ORAL | 0 refills | Status: DC

## 2019-03-10 NOTE — Telephone Encounter (Signed)
Mom called in for refill for Concerta. Last visit9/22/2020next visit 03/24/2019. Please escribe to [facility] on [address]

## 2019-03-10 NOTE — Telephone Encounter (Signed)
Concerta 54 mg daily, # 30 with no RF's.RX for above e-scribed and sent to pharmacy on record  [id number] - [place], [place] - [address] AT [address] [place] [address] Phone: [phone] Fax: [phone]   

## 2019-03-24 ENCOUNTER — Encounter: Payer: Self-pay | Admitting: Family

## 2019-03-24 ENCOUNTER — Ambulatory Visit (INDEPENDENT_AMBULATORY_CARE_PROVIDER_SITE_OTHER): Payer: 59 | Admitting: Family

## 2019-03-24 DIAGNOSIS — R278 Other lack of coordination: Secondary | ICD-10-CM

## 2019-03-24 DIAGNOSIS — F411 Generalized anxiety disorder: Secondary | ICD-10-CM

## 2019-03-24 DIAGNOSIS — Z79899 Other long term (current) drug therapy: Secondary | ICD-10-CM

## 2019-03-24 DIAGNOSIS — F900 Attention-deficit hyperactivity disorder, predominantly inattentive type: Secondary | ICD-10-CM | POA: Diagnosis not present

## 2019-03-24 DIAGNOSIS — F819 Developmental disorder of scholastic skills, unspecified: Secondary | ICD-10-CM

## 2019-03-24 DIAGNOSIS — Z719 Counseling, unspecified: Secondary | ICD-10-CM

## 2019-03-24 MED ORDER — METHYLPHENIDATE HCL ER (OSM) 54 MG PO TBCR: 54.0000 mg | EXTENDED_RELEASE_TABLET | Freq: Every day | ORAL | 0 refills | Status: DC

## 2019-03-24 NOTE — Progress Notes (Signed)
[facility] [place] [facility]. [address]  [place] [address] Dept: [phone] Dept Fax: [phone]  Medication Check visit via Virtual Video due to COVID-19  Patient ID:  Fernando Davies  male DOB: 06-Feb-1996   23 y.o.   MRN: [id number]   DATE:03/24/19  PCP: Elnita Maxwell, MD  Virtual Visit via Video Note  I connected with  Fernando Davies  and Fernando Davies 's Mother (Name [name]) on 03/24/19 at  2:00 PM EST by a video enabled telemedicine application and verified that I am speaking with the correct person using two identifiers. Patient/Parent Location: at home   I discussed the limitations, risks, security and privacy concerns of performing an evaluation and management service by telephone and the availability of in person appointments. I also discussed with the parents that there may be a patient responsible charge related to this service. The parents expressed understanding and agreed to proceed.  Provider: Carolann Littler, NP  Location: private location  HISTORY/CURRENT STATUS: Fernando Davies is here for medication management of the psychoactive medications for ADHD and review of educational and behavioral concerns.   Fernando Davies currently taking Prozac, Concerta, Clonidine, which is working well. Takes medication as directed.  Medication tends to wear off around the evening time for Concerta. Fernando Davies is able to focus through school/homework.   Fernando Davies is eating well (eating breakfast, lunch and dinner). Eating well during the day.   Sleeping well (goes to bed at 11-12:00 pm wakes at 8:00 am), sleeping through the night. Waking 1 time to use the BR.   EDUCATION/WORK: School: [employer]-2 for next semester Year/Grade: college  Performance/ Grades: average Services: Other: disability  Fernando Davies is currently in distance learning due to social distancing due to COVID-19 and will continue for at least: at least  for the fall semester.   Activities/ Exercise: intermittently  Screen time: (phone, tablet, TV, computer): computer for school, games, TV and movies.   MEDICAL HISTORY: Individual Medical History/ Review of Systems: Changes? :None reported recently. No flu shot yet.   Family Medical/ Social History: Changes? None  Patient Lives with: mother and brother.  Current Medications:  Current Outpatient Medications on File Prior to Visit  Medication Sig Dispense Refill  . cloNIDine (CATAPRES) 0.3 MG tablet TAKE 1 TABLET(0.3 MG) BY MOUTH AT BEDTIME 30 tablet 2  . FLUoxetine (PROZAC) 20 MG tablet TAKE 1 TABLET(20 MG) BY MOUTH DAILY 30 tablet 2  . Melatonin 10 MG TABS Take 1 tablet by mouth daily.    . Multiple Vitamin (MULTIVITAMIN) tablet Take 1 tablet by mouth daily.     No current facility-administered medications on file prior to visit.    Medication Side Effects: None  MENTAL HEALTH: Mental Health Issues:   Anxiety-Prozac for anxiety with good control.   DIAGNOSES:    ICD-10-CM   1. ADHD (attention deficit hyperactivity disorder), inattentive type  F90.0 methylphenidate 54 MG PO CR tablet  2. Generalized anxiety disorder  F41.1   3. Dysgraphia  R27.8   4. Learning difficulty  F81.9   5. Medication management  Z79.899   6. Patient counseled  Z71.9     RECOMMENDATIONS:  Discussed recent history with patient & parent with updates for health, academics, school, health and medication.   Discussed school academic progress and recommended continued accommodations for the school year.  Children and young adults with ADHD often suffer from disorganization, difficulty with time management, completing projects and other executive function  difficulties.  Recommended Reading: "Smart but Scattered" and "Smart but Scattered Teens" by Peg Arita Miss and Marjo Bicker.    Discussed continued need for structure, routine, reward (external), motivation (internal), positive reinforcement, consequences,  and organization with virtual school from home.   Encouraged recommended limitations on TV, tablets, phones, video games and computers for non-educational activities.   Discussed need for bedtime routine, use of good sleep hygiene, no video games, TV or phones for an hour before bedtime.   Encouraged physical activity and outdoor play, maintaining social distancing.   Counseled medication pharmacokinetics, options, dosage, administration, desired effects, and possible side effects.   Clonidine 0.3 mg at HS, No Rx Prozac 20 mg daily, no Rx Concerta 54 mg daily, # 30 with no RF's RX for above e-scribed and sent to pharmacy on record  [facility][id number] [name], [place] - [address] AT [facility] OF [facility] [address] [place] [address] Phone: [phone] Fax: [phone]  I discussed the assessment and treatment plan with the patient & parent. The patient & parent was provided an opportunity to ask questions and all were answered. The patient & parent agreed with the plan and demonstrated an understanding of the instructions.   I provided 35 minutes of non-face-to-face time during this encounter.   Completed record review for 10 minutes prior to the virtual video visit.   NEXT APPOINTMENT:  Return in about 3 months (around 06/22/2019) for follow up visit.  The patient & parent was advised to call back or seek an in-person evaluation if the symptoms worsen or if the condition fails to improve as anticipated.  Medical Decision-making: More than 50% of the appointment was spent counseling and discussing diagnosis and management of symptoms with the patient and family.  Carron Curie, NP

## 2019-04-13 ENCOUNTER — Other Ambulatory Visit: Payer: Self-pay | Admitting: Pediatrics

## 2019-04-13 DIAGNOSIS — F411 Generalized anxiety disorder: Secondary | ICD-10-CM

## 2019-04-13 DIAGNOSIS — F900 Attention-deficit hyperactivity disorder, predominantly inattentive type: Secondary | ICD-10-CM

## 2019-04-13 NOTE — Telephone Encounter (Signed)
Prozac 20 mg daily, # 30 with 2 RF's and Clonidine 0.3 mg daily, # 30 with 2 RF's.RX for above e-scribed and sent to pharmacy on record  [id number] [address], [place] - [place] [address] Phone: [phone] Fax: [phone]

## 2019-05-08 ENCOUNTER — Other Ambulatory Visit: Payer: Self-pay

## 2019-05-08 DIAGNOSIS — F900 Attention-deficit hyperactivity disorder, predominantly inattentive type: Secondary | ICD-10-CM

## 2019-05-08 MED ORDER — METHYLPHENIDATE HCL ER (OSM) 54 MG PO TBCR: 54.0000 mg | EXTENDED_RELEASE_TABLET | Freq: Every day | ORAL | 0 refills | Status: DC

## 2019-05-08 NOTE — Telephone Encounter (Signed)
Concerta 54 mg daily, # 30 with no RF's.RX for above e-scribed and sent to pharmacy on record  [id number] - [place], [place] - [address] AT [address] [place] [address] Phone: [phone] Fax: [phone]   

## 2019-05-08 NOTE — Telephone Encounter (Signed)
Mom called in for refill for Concerta. Last visit12/04/2018. Please escribe to [facility] on [address]

## 2019-06-09 ENCOUNTER — Other Ambulatory Visit: Payer: Self-pay

## 2019-06-09 DIAGNOSIS — F900 Attention-deficit hyperactivity disorder, predominantly inattentive type: Secondary | ICD-10-CM

## 2019-06-09 NOTE — Telephone Encounter (Signed)
Mom called in for refill for Concerta. Last visit12/04/2018 next visit 06/30/2019. Please escribe to [facility] on [address]

## 2019-06-10 MED ORDER — METHYLPHENIDATE HCL ER (OSM) 54 MG PO TBCR: 54.0000 mg | EXTENDED_RELEASE_TABLET | Freq: Every day | ORAL | 0 refills | Status: DC

## 2019-06-10 NOTE — Telephone Encounter (Signed)
E-Prescribed Concerta 54 directly to  [id number] - [place], [place] - [address] AT [address] [place] [address] Phone: [phone] Fax: [phone]  

## 2019-06-30 ENCOUNTER — Other Ambulatory Visit: Payer: Self-pay

## 2019-06-30 ENCOUNTER — Ambulatory Visit (INDEPENDENT_AMBULATORY_CARE_PROVIDER_SITE_OTHER): Payer: 59 | Admitting: Family

## 2019-06-30 ENCOUNTER — Encounter: Payer: Self-pay | Admitting: Family

## 2019-06-30 DIAGNOSIS — F411 Generalized anxiety disorder: Secondary | ICD-10-CM | POA: Diagnosis not present

## 2019-06-30 DIAGNOSIS — Z79899 Other long term (current) drug therapy: Secondary | ICD-10-CM

## 2019-06-30 DIAGNOSIS — F840 Autistic disorder: Secondary | ICD-10-CM

## 2019-06-30 DIAGNOSIS — F900 Attention-deficit hyperactivity disorder, predominantly inattentive type: Secondary | ICD-10-CM

## 2019-06-30 DIAGNOSIS — F819 Developmental disorder of scholastic skills, unspecified: Secondary | ICD-10-CM | POA: Diagnosis not present

## 2019-06-30 DIAGNOSIS — Z719 Counseling, unspecified: Secondary | ICD-10-CM

## 2019-06-30 DIAGNOSIS — R278 Other lack of coordination: Secondary | ICD-10-CM

## 2019-06-30 MED ORDER — METHYLPHENIDATE HCL ER (OSM) 54 MG PO TBCR: 54.0000 mg | EXTENDED_RELEASE_TABLET | Freq: Every day | ORAL | 0 refills | Status: DC

## 2019-06-30 MED ORDER — CLONIDINE HCL 0.3 MG PO TABS: ORAL_TABLET | ORAL | 2 refills | Status: DC

## 2019-06-30 MED ORDER — FLUOXETINE HCL 20 MG PO TABS: ORAL_TABLET | ORAL | 2 refills | Status: DC

## 2019-06-30 NOTE — Progress Notes (Signed)
[facility] [place]. [address] [place] [address] Dept: [phone] Dept Fax: [phone]  Medication Check visit via Virtual Video due to COVID-19  Patient ID:  Fernando Davies  male DOB: Jan 05, 1996   24 y.o.   MRN: [id number]   DATE:06/30/19  PCP: Elnita Maxwell, MD  Virtual Visit via Video Note  I connected with  Fernando Davies on 06/30/19 at  2:00 PM EST by a video enabled telemedicine application and verified that I am speaking with the correct person using two identifiers. Patient/Parent Location: at home   I discussed the limitations, risks, security and privacy concerns of performing an evaluation and management service by telephone and the availability of in person appointments. I also discussed with the parents that there may be a patient responsible charge related to this service. The parents expressed understanding and agreed to proceed.  Provider: Carolann Littler, NP  Location: private location  HISTORY/CURRENT STATUS: Fernando Davies is here for medication management of the psychoactive medications for ADHD and review of educational and behavioral concerns.   Fernando Davies currently taking medication regimen, which is working well. Takes medication as directed. Medication tends to last as long as needed.  Fernando Davies is able to focus through school/homework.   Fernando Davies is eating well (eating breakfast, lunch and dinner). Eating with no recent changes.   Sleeping well (goes to bed at 11:00 pm wakes at 8-9:00 am), sleeping through the night. Clonidine and Melatonin at HS with no issues.   EDUCATION: School: [employer] last 2 classes and will be done. Looking at 3 other options for next year.   Year/Grade: college  Performance/ Grades: average Services: Other: Help from mother  Fernando Davies is currently in distance learning due to social distancing due to COVID-19 and will continue through: the remainder  of the school year.   Activities/ Exercise: intermittently-walks in the neighborhood and swinging on the swing set and scooter.   Screen time: (phone, tablet, TV, computer): computer for learning, phone, TV and movies.   MEDICAL HISTORY: Individual Medical History/ Review of Systems: Changes? :None reported recently.   Family Medical/ Social History: Changes? None reported Patient Lives with: mother  Current Medications:  Current Outpatient Medications  Medication Instructions  . cloNIDine (CATAPRES) 0.3 MG tablet TAKE 1 TABLET BY MOUTH AT BEDTIME  . FLUoxetine (PROZAC) 20 MG tablet TAKE 1 TABLET BY MOUTH DAILY  . Melatonin 10 MG TABS 1 tablet, Oral, Daily  . methylphenidate (CONCERTA) 54 mg, Oral, Daily with breakfast  . Multiple Vitamin (MULTIVITAMIN) tablet 1 tablet, Oral, Daily   Medication Side Effects: None  MENTAL HEALTH: Mental Health Issues:   Anxiety-Doing well with medication and controlling symptoms.     DIAGNOSES:    ICD-10-CM   1. ADHD (attention deficit hyperactivity disorder), inattentive type  F90.0 cloNIDine (CATAPRES) 0.3 MG tablet    methylphenidate 54 MG PO CR tablet  2. Autism spectrum disorder  F84.0   3. Generalized anxiety disorder  F41.1 FLUoxetine (PROZAC) 20 MG tablet  4. Learning difficulty  F81.9   5. Dysgraphia  R27.8   6. Medication management  Z79.899   7. Patient counseled  Z71.9     RECOMMENDATIONS:  Discussed recent history with patient with updates for school, learning, academic success,   Discussed school academic progress and recommended continued accommodations as needed for learning support.   Discussed growth and development and current weight. Recommended healthy food choices, watching portion sizes, avoiding second  helpings, avoiding sugary drinks like soda and tea, drinking more water, getting more exercise.   Discussed continued need for structure, routine, reward (external), motivation (internal), positive reinforcement,  consequences, and organization with school and virtual learning at home.   Encouraged recommended limitations on TV, tablets, phones, video games and computers for non-educational activities.   Discussed need for bedtime routine, use of good sleep hygiene, no video games, TV or phones for an hour before bedtime.   Encouraged physical activity and outdoor play, maintaining social distancing.   Counseled medication pharmacokinetics, options, dosage, administration, desired effects, and possible side effects.   Concerta 54 mg daily, # 30 with no RF's Clonidine 0.3 mg at HS, # 30 with 2 RF's Prozac 20 mg daily, # 30 with 2 RF's RX for above e-scribed and sent to pharmacy on record  [facility][id number] [name], [place] - [address] AT [facility] OF [facility] & [address] [place] [address] Phone: [phone] Fax: [phone]  I discussed the assessment and treatment plan with the patient/parent. The patient/parent was provided an opportunity to ask questions and all were answered. The patient/ parent agreed with the plan and demonstrated an understanding of the instructions.   I provided 30 minutes of non-face-to-face time during this encounter.   Completed record review for 10 minutes prior to the virtual video visit.   NEXT APPOINTMENT:  No follow-ups on file.  The patient/parent was advised to call back or seek an in-person evaluation if the symptoms worsen or if the condition fails to improve as anticipated.  Medical Decision-making: More than 50% of the appointment was spent counseling and discussing diagnosis and management of symptoms with the patient and family.  Carron Curie, NP

## 2019-07-30 ENCOUNTER — Other Ambulatory Visit: Payer: Self-pay | Admitting: Family

## 2019-07-30 DIAGNOSIS — F900 Attention-deficit hyperactivity disorder, predominantly inattentive type: Secondary | ICD-10-CM

## 2019-07-30 MED ORDER — METHYLPHENIDATE HCL ER (OSM) 54 MG PO TBCR: 54.0000 mg | EXTENDED_RELEASE_TABLET | Freq: Every day | ORAL | 0 refills | Status: DC

## 2019-07-30 NOTE — Telephone Encounter (Signed)
Mom called for refill for Concerta.  Patient last seen 06/30/19, next appointment 10/08/19.  Please e-scribe to [facility] on [address]. 

## 2019-07-30 NOTE — Telephone Encounter (Signed)
E-Prescribed Concerta  directly to  [facility] #[id number] [name], [place] - [address] AT [facility] & [address] [place] [address] Phone: [phone] Fax: [phone]

## 2019-09-07 ENCOUNTER — Other Ambulatory Visit: Payer: Self-pay

## 2019-09-07 DIAGNOSIS — F900 Attention-deficit hyperactivity disorder, predominantly inattentive type: Secondary | ICD-10-CM

## 2019-09-07 MED ORDER — METHYLPHENIDATE HCL ER (OSM) 54 MG PO TBCR: 54.0000 mg | EXTENDED_RELEASE_TABLET | Freq: Every day | ORAL | 0 refills | Status: DC

## 2019-09-07 NOTE — Telephone Encounter (Signed)
Mom called for refill for Concerta.  Patient last seen 06/30/19, next appointment 10/08/19.  Please e-scribe to [facility] on [address].

## 2019-09-07 NOTE — Telephone Encounter (Signed)
E-Prescribed Concerta 54 directly to  [id number] - [place], [place] - [address] AT [facility] OF [address] & [address] [place] [address] Phone: [phone] Fax: [phone]  

## 2019-10-01 ENCOUNTER — Other Ambulatory Visit: Payer: Self-pay

## 2019-10-01 DIAGNOSIS — F900 Attention-deficit hyperactivity disorder, predominantly inattentive type: Secondary | ICD-10-CM

## 2019-10-01 MED ORDER — METHYLPHENIDATE HCL ER (OSM) 54 MG PO TBCR: 54.0000 mg | EXTENDED_RELEASE_TABLET | Freq: Every day | ORAL | 0 refills | Status: DC

## 2019-10-01 NOTE — Telephone Encounter (Signed)
Mom called for refill for Concerta.  Patient last seen 06/30/19, next appointment 10/08/19.  Please e-scribe to [facility] on [address]. 

## 2019-10-01 NOTE — Telephone Encounter (Signed)
Concerta 54 mg daily, # 30 with no RF's.RX for above e-scribed and sent to pharmacy on record  [id number] - [place], [place] - [address] AT [facility] OF [address] & [address] [place] [address] Phone: [phone] Fax: [phone]   

## 2019-10-08 ENCOUNTER — Encounter: Payer: 59 | Admitting: Family

## 2019-10-10 ENCOUNTER — Other Ambulatory Visit: Payer: Self-pay | Admitting: Family

## 2019-10-10 DIAGNOSIS — F900 Attention-deficit hyperactivity disorder, predominantly inattentive type: Secondary | ICD-10-CM

## 2019-10-10 DIAGNOSIS — F411 Generalized anxiety disorder: Secondary | ICD-10-CM

## 2019-10-12 NOTE — Telephone Encounter (Signed)
Last visit 06/30/2019 next visit 10/20/2019

## 2019-10-12 NOTE — Telephone Encounter (Signed)
E-Prescribed fluoxetine 20 mg and clonidine 0.3 directly to  [facility] #[id number] [name], [place] - [address] AT [facility] OF [facility] & [address] [place] [address] Phone: [phone] Fax: [phone]

## 2019-10-14 ENCOUNTER — Telehealth: Payer: Self-pay

## 2019-10-14 NOTE — Telephone Encounter (Signed)
Emailed forms 05/27/2019-Spoke with mom 06/09/2019. Spoke with mom today and let her know I will email forms and if possible get them back to me before the appointment or bring them in

## 2019-10-20 ENCOUNTER — Encounter: Payer: Self-pay | Admitting: Family

## 2019-10-20 ENCOUNTER — Ambulatory Visit (INDEPENDENT_AMBULATORY_CARE_PROVIDER_SITE_OTHER): Payer: 59 | Admitting: Family

## 2019-10-20 ENCOUNTER — Other Ambulatory Visit: Payer: Self-pay

## 2019-10-20 VITALS — BP 116/72 | HR 76 | Resp 16 | Ht 62.6 in | Wt 198.0 lb

## 2019-10-20 DIAGNOSIS — F900 Attention-deficit hyperactivity disorder, predominantly inattentive type: Secondary | ICD-10-CM

## 2019-10-20 DIAGNOSIS — F819 Developmental disorder of scholastic skills, unspecified: Secondary | ICD-10-CM | POA: Diagnosis not present

## 2019-10-20 DIAGNOSIS — Z7189 Other specified counseling: Secondary | ICD-10-CM

## 2019-10-20 DIAGNOSIS — F411 Generalized anxiety disorder: Secondary | ICD-10-CM

## 2019-10-20 DIAGNOSIS — Z719 Counseling, unspecified: Secondary | ICD-10-CM

## 2019-10-20 DIAGNOSIS — F840 Autistic disorder: Secondary | ICD-10-CM | POA: Diagnosis not present

## 2019-10-20 DIAGNOSIS — Z79899 Other long term (current) drug therapy: Secondary | ICD-10-CM

## 2019-10-20 MED ORDER — METHYLPHENIDATE HCL ER (OSM) 54 MG PO TBCR: 54.0000 mg | EXTENDED_RELEASE_TABLET | Freq: Every day | ORAL | 0 refills | Status: DC

## 2019-10-20 NOTE — Progress Notes (Signed)
[facility] [place] [facility] [address] [place] [address] Dept: [phone] Dept Fax: [phone] Loc: [phone] Loc Fax: [phone]  Medication Check  Patient ID: Fernando Davies, male  DOB: 1995/08/28, 24 y.o.  MRN: [id number]  Date of Evaluation: 10/20/2019 PCP: Eliberto Ivory, MD  Accompanied by: Mother Patient Lives with: mother  HISTORY/CURRENT STATUS: HPI Patient here with mother for the visit today. Patient interactive and appropriate with provider answering questions. 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 Patient has continued with his medication regimen with no reported changes or side effects reported.   EDUCATION: School: [employer] graduated Year/Grade: Graduated 2 year degree program Performance/ Grades: average Services: IEP/504 Plan Activities/ Exercise: intermittently-swimming in the summer.Walking now more and occasionally swinging   MEDICAL HISTORY: Appetite: Good  MVI/Other: MVI daily. Cutting back on Sodas and keeping track of his weight. Eating with no issues or changes recently  Sleep:Getting plenty of sleep. Concerns: Initiation/Maintenance/Other: None, Occasionally will wake to go to the bathroom  Individual Medical History/ Review of Systems: Changes? :None reported recently. History of sinusitis with Abx in May.   Allergies: Patient has no known allergies.  Current Medications:  Current Outpatient Medications:  .  cloNIDine (CATAPRES) 0.3 MG tablet, TAKE 1 TABLET BY MOUTH AT BEDTIME, Disp: 30 tablet, Rfl: 0 .  FLUoxetine (PROZAC) 20 MG tablet, TAKE 1 TABLET BY MOUTH DAILY, Disp: 30 tablet, Rfl: 0 .  Melatonin 10 MG TABS, Take 1 tablet by mouth daily., Disp: , Rfl:  .  [START ON 10/30/2019] methylphenidate 54  MG PO CR tablet, Take 1 tablet (54 mg total) by mouth daily with breakfast., Disp: 30 tablet, Rfl: 0 .  Multiple Vitamin (MULTIVITAMIN) tablet, Take 1 tablet by mouth daily., Disp: , Rfl:  Medication Side Effects: None  Family Medical/ Social History: Changes? None   MENTAL HEALTH: Mental Health Issues: Anxiety-Prozac 20 mg daily  PHYSICAL EXAM; Vitals:  Vitals:   10/20/19 0810  BP: 116/72  Pulse: 76  Resp: 16  Height: 5' 2.6" (1.59 m)  Weight: 198 lb (89.8 kg)  BMI (Calculated): 35.53    General Physical Exam: Unchanged from previous exam, date:none reported Changed:none  Testing/Developmental Screens:  not completed today and discussed current concerns  DIAGNOSES:    ICD-10-CM   1. ADHD (attention deficit hyperactivity disorder), inattentive type  F90.0 methylphenidate 54 MG PO CR tablet  2. Autism spectrum disorder  F84.0   3. Generalized anxiety disorder  F41.1   4. Learning difficulty  F81.9   5. Medication management  Z79.899   6. Patient counseled  Z71.9   7. Goals of care, counseling/discussion  Z71.89    RECOMMENDATIONS: Counseling at this visit included the review of old records and/or current chart with the patient & parent with updates for school, learning, health and medications.   Discussed recent history and today's examination with patient & parent with no changes on examination.   Counseled regarding school with educational support for any learning environments.   Recommended a high protein, low sugar diet, watch portion sizes, avoid second helpings, avoid sugary snacks and drinks, drink more water, eat more fruits and vegetables, increase daily exercise.  Discussed school academic and behavioral progress and advocated for appropriate accommodations with learning when needing support for learning.   Discussed importance of  maintaining structure, routine, organization, reward, motivation and consequences with consistency with home, school, and activities.    Counseled medication pharmacokinetics, options, dosage, administration, desired effects, and possible side effects.   Prozac 20 mg daily, no Rx today Clonidine 0.3 mg daily, no Rx today Concerta 54 mg daily, # 30 with no RF's with postdate for 10/31/2019 RX for above e-scribed and sent to pharmacy on record  [facility] #[id number] - [place], [place] - [address] AT [facility] OF [facility] & [address] [place] [address] Phone: [phone] Fax: [phone]  Advised importance of:  Good sleep hygiene (8- 10 hours per night, no TV or video games for 1 hour before bedtime) Limited screen time (none on school nights, no more than 2 hours/day on weekends, use of screen time for motivation) Regular exercise(outside and active play) Healthy eating (drink water or milk, no sodas/sweet tea, limit portions and no seconds).   NEXT APPOINTMENT: Return in about 3 months (around 01/20/2020) for f/u visit.  Medical Decision-making: More than 50% of the appointment was spent counseling and discussing diagnosis and management of symptoms with the patient and family.  Carron Curie, NP Counseling Time: 35 mins  Total Contact Time: 40 mins

## 2019-11-09 ENCOUNTER — Other Ambulatory Visit: Payer: Self-pay | Admitting: Pediatrics

## 2019-11-09 DIAGNOSIS — F411 Generalized anxiety disorder: Secondary | ICD-10-CM

## 2019-11-09 DIAGNOSIS — F900 Attention-deficit hyperactivity disorder, predominantly inattentive type: Secondary | ICD-10-CM

## 2019-11-10 NOTE — Telephone Encounter (Signed)
Last visit 10/20/2019 next visit 01/01/2020

## 2019-11-11 NOTE — Telephone Encounter (Signed)
Clonidine 0.3 mg at HS, # 30 with no RF's and Prozac 20 mg with no RF's.RX for above e-scribed and sent to pharmacy on record  [facility] #[id number] [name], [place] - [address] AT [facility] OF [facility] & [address] [place] [address] Phone: [phone] Fax: [phone]

## 2019-12-01 ENCOUNTER — Other Ambulatory Visit: Payer: Self-pay

## 2019-12-01 DIAGNOSIS — F900 Attention-deficit hyperactivity disorder, predominantly inattentive type: Secondary | ICD-10-CM

## 2019-12-01 MED ORDER — METHYLPHENIDATE HCL ER (OSM) 54 MG PO TBCR: 54.0000 mg | EXTENDED_RELEASE_TABLET | Freq: Every day | ORAL | 0 refills | Status: DC

## 2019-12-01 NOTE — Telephone Encounter (Signed)
RX for above e-scribed and sent to pharmacy on record ? ?[id number] - [place], [place] - [address] AT [facility] OF [address] ?[address] ?[place] [address] ?Phone: [phone] Fax: [phone] ? ? ?

## 2019-12-01 NOTE — Telephone Encounter (Signed)
Mom called in for refill for Concerta. Last visit 10/20/2019 next visit 01/01/2020. Please escribe to [facility] on [address]

## 2019-12-09 ENCOUNTER — Other Ambulatory Visit: Payer: Self-pay | Admitting: Family

## 2019-12-09 DIAGNOSIS — F411 Generalized anxiety disorder: Secondary | ICD-10-CM

## 2019-12-09 DIAGNOSIS — F900 Attention-deficit hyperactivity disorder, predominantly inattentive type: Secondary | ICD-10-CM

## 2019-12-09 NOTE — Telephone Encounter (Signed)
Last visit 10/20/2019 next visit 01/01/2020 

## 2019-12-09 NOTE — Telephone Encounter (Signed)
RX for above e-scribed and sent to pharmacy on record ? ?[id number] - [place], [place] - [address] AT [facility] OF [address] ?[address] ?[place]  [address] ?Phone: [phone] Fax: [phone] ? ? ?

## 2020-01-01 ENCOUNTER — Telehealth (INDEPENDENT_AMBULATORY_CARE_PROVIDER_SITE_OTHER): Payer: 59 | Admitting: Family

## 2020-01-01 ENCOUNTER — Encounter: Payer: Self-pay | Admitting: Family

## 2020-01-01 ENCOUNTER — Other Ambulatory Visit: Payer: Self-pay

## 2020-01-01 DIAGNOSIS — Z79899 Other long term (current) drug therapy: Secondary | ICD-10-CM

## 2020-01-01 DIAGNOSIS — F411 Generalized anxiety disorder: Secondary | ICD-10-CM | POA: Diagnosis not present

## 2020-01-01 DIAGNOSIS — Z7189 Other specified counseling: Secondary | ICD-10-CM

## 2020-01-01 DIAGNOSIS — F900 Attention-deficit hyperactivity disorder, predominantly inattentive type: Secondary | ICD-10-CM | POA: Diagnosis not present

## 2020-01-01 DIAGNOSIS — Z719 Counseling, unspecified: Secondary | ICD-10-CM

## 2020-01-01 DIAGNOSIS — F840 Autistic disorder: Secondary | ICD-10-CM | POA: Diagnosis not present

## 2020-01-01 MED ORDER — METHYLPHENIDATE HCL ER (OSM) 54 MG PO TBCR: 54.0000 mg | EXTENDED_RELEASE_TABLET | Freq: Every day | ORAL | 0 refills | Status: DC

## 2020-01-01 NOTE — Progress Notes (Signed)
[place] [facility] [address], [place]. [address] [place] [address] Dept: [phone] Dept Fax: [phone]  Medication Check visit via Virtual Video due to COVID-19  Patient ID:  Fernando Davies  male DOB: Jun 04, 1995   24 y.o.   MRN: [id number]   DATE:01/01/20  PCP: Eliberto Ivory, MD  Virtual Visit via Video Note  I connected with  Fernando Davies  and Fernando Davies 's Mother (Name [name]) on 01/01/20 at  2:00 PM EDT by a video enabled telemedicine application and verified that I am speaking with the correct person using two identifiers. Patient/Parent Location: at mother's work   I discussed the limitations, risks, security and privacy concerns of performing an evaluation and management service by telephone and the availability of in person appointments. I also discussed with the parents that there may be a patient responsible charge related to this service. The parents expressed understanding and agreed to proceed.  Provider: Carron Curie, NP  Location: private location  HISTORY/CURRENT STATUS: Fernando Davies is here for medication management of the psychoactive medications for ADHD and review of educational and behavioral concerns.   Fernando Davies currently taking medication regimen daily, which is working well. Takes medication as directed by  provider. Medication tends to last for the time needed. Fernando Davies is able to focus through work.   Fernando Davies is eating well (eating breakfast, lunch and dinner). Eating well with limiting soda intake.   Sleeping well (getting plenty of sleep), sleeping through the night. Clonidine and Melatonin at HS with no changes.   EDUCATION/WORK: School: [employer] graduated Year/Grade: Graduated 2 year degree program Performance/ Grades: average Services: IEP/504 Plan Looking at alternative programs  Activities/ Exercise: intermittently-swimming in the summer.Walking now more and  occasionally swinging   Screen time: (phone, tablet, TV, computer): computer for movies and games  MEDICAL HISTORY: Individual Medical History/ Review of Systems: Changes? :None reported by mother or patient.   Family Medical/ Social History: Changes? None reported recently.  Patient Lives with: mother and stepfather  Current Medications:  Current Outpatient Medications  Medication Instructions   cloNIDine (CATAPRES) 0.3 MG tablet TAKE 1 TABLET BY MOUTH AT BEDTIME   FLUoxetine (PROZAC) 20 MG tablet TAKE 1 TABLET BY MOUTH DAILY   Melatonin 10 MG TABS 1 tablet, Oral, Daily   methylphenidate (CONCERTA) 54 mg, Oral, Daily with breakfast   Multiple Vitamin (MULTIVITAMIN) tablet 1 tablet, Oral, Daily   Medication Side Effects: None  MENTAL HEALTH: Mental Health Issues:   Anxiety-Prozac with good symptom control.  DIAGNOSES:    ICD-10-CM   1. ADHD (attention deficit hyperactivity disorder), inattentive type  F90.0 methylphenidate 54 MG PO CR tablet  2. Autism spectrum disorder  F84.0   3. Generalized anxiety disorder  F41.1   4. Medication management  Z79.899   5. Patient counseled  Z71.9   6. Goals of care, counseling/discussion  Z71.89    RECOMMENDATIONS:  Discussed recent history with patient & parent with school, education, learning, plans for continuing education, health and medications.   Discussed school academic progress and recommended continued accommodations as needed for learning support when enrolled in school.   Discussed growth and development and current weight. Recommended healthy food choices, watching portion sizes, avoiding second helpings, avoiding sugary drinks like soda and tea, drinking more water, getting more exercise.   Discussed continued need for structure, routine, reward (external), motivation (internal), positive reinforcement, consequences, and organization with home, schooling online and social activities.  Encouraged recommended limitations  on TV, tablets, phones, video games and computers for non-educational activities.   Discussed need for bedtime routine, use of good sleep hygiene, no video games, TV or phones for an hour before bedtime.   Encouraged physical activity and outdoor play, maintaining social distancing.   Counseled medication pharmacokinetics, options, dosage, administration, desired effects, and possible side effects.   Prozac 20 mg daily, no Rx today Clonidine 0.3 mg at HS, no Rx today Concerta 54 mg daily, # 30 with no RF's RX for above e-scribed and sent to pharmacy on record  [facility] #[id number] [name], [place] - [address] AT [facility] OF [facility] & [address] [place] [address] Phone: [phone] Fax: [phone]  I discussed the assessment and treatment plan with the patient/parent. The patient/parent was provided an opportunity to ask questions and all were answered. The patient/ parent agreed with the plan and demonstrated an understanding of the instructions.   I provided 25 minutes of non-face-to-face time during this encounter.   Completed record review for 10 minutes prior to the virtual video visit.   NEXT APPOINTMENT:  Return in about 3 months (around 04/01/2020) for f/u visit.  The patient/parent was advised to call back or seek an in-person evaluation if the symptoms worsen or if the condition fails to improve as anticipated.  Medical Decision-making: More than 50% of the appointment was spent counseling and discussing diagnosis and management of symptoms with the patient and family.  Carron Curie, NP

## 2020-02-03 ENCOUNTER — Other Ambulatory Visit: Payer: Self-pay

## 2020-02-03 DIAGNOSIS — F900 Attention-deficit hyperactivity disorder, predominantly inattentive type: Secondary | ICD-10-CM

## 2020-02-03 MED ORDER — METHYLPHENIDATE HCL ER (OSM) 54 MG PO TBCR: 54.0000 mg | EXTENDED_RELEASE_TABLET | Freq: Every day | ORAL | 0 refills | Status: DC

## 2020-02-03 NOTE — Telephone Encounter (Signed)
Concerta 54 mg daily, # 30 with no RF's.RX for above e-scribed and sent to pharmacy on record  [facility][id number] - [place], [place] - [address] AT [facility] OF [address] & [address] [place] [address] Phone: [phone] Fax: [phone]   

## 2020-02-03 NOTE — Telephone Encounter (Signed)
Mom called in for refill for Concerta. Last visit 01/01/2020. Please escribe to [facility] on [address]

## 2020-03-02 ENCOUNTER — Other Ambulatory Visit: Payer: Self-pay

## 2020-03-02 DIAGNOSIS — F900 Attention-deficit hyperactivity disorder, predominantly inattentive type: Secondary | ICD-10-CM

## 2020-03-02 DIAGNOSIS — F411 Generalized anxiety disorder: Secondary | ICD-10-CM

## 2020-03-02 MED ORDER — FLUOXETINE HCL 20 MG PO TABS: 20.0000 mg | ORAL_TABLET | Freq: Every day | ORAL | 2 refills | Status: DC

## 2020-03-02 MED ORDER — CLONIDINE HCL 0.3 MG PO TABS: 0.3000 mg | ORAL_TABLET | Freq: Every day | ORAL | 2 refills | Status: DC

## 2020-03-02 MED ORDER — METHYLPHENIDATE HCL ER (OSM) 54 MG PO TBCR: 54.0000 mg | EXTENDED_RELEASE_TABLET | Freq: Every day | ORAL | 0 refills | Status: DC

## 2020-03-02 NOTE — Telephone Encounter (Signed)
Concerta 54 mg daily, # 30 with no RF's, Prozac 20 mg daily, # 30 with 2 RF's and Clonidine 0.3 mg at HS, # 30 with 2 RF's.RX for above e-scribed and sent to pharmacy on record  [facility] #[id number] [name], [place] - [address] AT [facility] OF [facility] & [address] [place] [address] Phone: [phone] Fax: [phone]

## 2020-03-02 NOTE — Telephone Encounter (Signed)
Mom called in for refill for Concerta, Catapres, and Prozac. Last visit 01/01/2020 next visit 04/18/2020. Please escribe to [facility] on [address]

## 2020-03-31 ENCOUNTER — Other Ambulatory Visit: Payer: Self-pay

## 2020-03-31 DIAGNOSIS — F900 Attention-deficit hyperactivity disorder, predominantly inattentive type: Secondary | ICD-10-CM

## 2020-03-31 MED ORDER — METHYLPHENIDATE HCL ER (OSM) 54 MG PO TBCR: 54.0000 mg | EXTENDED_RELEASE_TABLET | Freq: Every day | ORAL | 0 refills | Status: DC

## 2020-03-31 NOTE — Telephone Encounter (Signed)
RX for above e-scribed and sent to pharmacy on record ? ?[facility][id number] - [place], [place] - [address] AT [facility] OF [address] ?[address] ?[place]  [address] ?Phone: [phone] Fax: [phone] ? ? ?

## 2020-03-31 NOTE — Telephone Encounter (Signed)
Mom called in for refill for Concerta. Last visit 01/01/2020 next visit 04/18/2020. Please escribe to [facility] on [address]

## 2020-04-18 ENCOUNTER — Telehealth (INDEPENDENT_AMBULATORY_CARE_PROVIDER_SITE_OTHER): Payer: 59 | Admitting: Family

## 2020-04-18 ENCOUNTER — Encounter: Payer: Self-pay | Admitting: Family

## 2020-04-18 ENCOUNTER — Other Ambulatory Visit: Payer: Self-pay

## 2020-04-18 DIAGNOSIS — F840 Autistic disorder: Secondary | ICD-10-CM | POA: Diagnosis not present

## 2020-04-18 DIAGNOSIS — Z79899 Other long term (current) drug therapy: Secondary | ICD-10-CM

## 2020-04-18 DIAGNOSIS — F411 Generalized anxiety disorder: Secondary | ICD-10-CM | POA: Diagnosis not present

## 2020-04-18 DIAGNOSIS — F900 Attention-deficit hyperactivity disorder, predominantly inattentive type: Secondary | ICD-10-CM

## 2020-04-18 DIAGNOSIS — F819 Developmental disorder of scholastic skills, unspecified: Secondary | ICD-10-CM

## 2020-04-18 DIAGNOSIS — Z7189 Other specified counseling: Secondary | ICD-10-CM

## 2020-04-18 DIAGNOSIS — Z719 Counseling, unspecified: Secondary | ICD-10-CM

## 2020-04-18 DIAGNOSIS — R278 Other lack of coordination: Secondary | ICD-10-CM

## 2020-04-18 MED ORDER — METHYLPHENIDATE HCL ER (OSM) 54 MG PO TBCR: 54.0000 mg | EXTENDED_RELEASE_TABLET | Freq: Every day | ORAL | 0 refills | Status: DC

## 2020-04-18 NOTE — Progress Notes (Signed)
Fernando Davies [facility] [address] [place] [place] [address] Dept: [phone] Dept Fax: [phone]  Medication Check visit via Virtual Video   Patient ID:  Fernando Davies  male DOB: 1995-09-28   24 y.o.   MRN: [id number]   DATE:04/18/20  PCP: Eliberto Ivory, MD  Virtual Visit via Video Note  I connected with  Fernando Davies  and Fernando Davies 's Mother (Name Fernando Davies) on 04/18/20 at 10:00 AM EST by a video enabled telemedicine application and verified that I am speaking with the correct person using two identifiers. Patient/Parent Location: at home   I discussed the limitations, risks, security and privacy concerns of performing an evaluation and management service by telephone and the availability of in person appointments. I also discussed with the parents that there may be a patient responsible charge related to this service. The parents expressed understanding and agreed to proceed.  Provider: Carron Curie, NP  Location: private work location  HISTORY/CURRENT STATUS: Fernando Davies is here for medication management of the psychoactive medications for ADHD and review of educational and behavioral concerns.   Fernando Davies currently taking medication, which is working well. Takes medication as directed daily. Medication tends to wear off around . Fernando Davies is able to focus through homework.   Fernando Davies is eating well (eating breakfast, lunch and dinner). No changes with eating recently. Eating a little less with watching his dietary intake and more water.   Sleeping well (goes to bed at 11:00 pm wakes at 9:00 am), sleeping through the night. Clonidine 0.3 mg daily for sleep initiation.   EDUCATION/WORK: Graduated from his 2 year degree program from [employer] Looking at options for school or programs to assist with an interest in film making. Started his program and mentor with Fernando Davies. Meets 1 time/week.  6-9 months for the program.  Looking at pitching his movie to executives with pitch reel.  Activities/ Exercise: intermittently-some with walking the dog.   Screen time: (phone, tablet, TV, computer): Computer for school, phone, TV and movies with games.   MEDICAL HISTORY: Individual Medical History/ Review of Systems: Changes? :None reported. Sinusitis recently with medication with long recovery process with some congestion still.   Family Medical/ Social History: Changes? None Patient Lives with: mother and stepfather  Current Medications:  Current Outpatient Medications  Medication Instructions  . cetirizine (ZYRTEC) 10 mg, Oral, Daily  . cloNIDine (CATAPRES) 0.3 mg, Oral, Daily at bedtime  . FLUoxetine (PROZAC) 20 mg, Oral, Daily  . Melatonin 10 MG TABS 1 tablet, Oral, Daily  . [START ON 04/30/2020] methylphenidate (CONCERTA) 54 mg, Oral, Daily with breakfast  . Multiple Vitamin (MULTIVITAMIN) tablet 1 tablet, Oral, Daily   Medication Side Effects: None  MENTAL HEALTH: Mental Health Issues:   Anxiety-Prozac controlling symptoms.   DIAGNOSES:    ICD-10-CM   1. ADHD (attention deficit hyperactivity disorder), inattentive type  F90.0 methylphenidate 54 MG PO CR tablet  2. Autism spectrum disorder  F84.0   3. Generalized anxiety disorder  F41.1   4. Dysgraphia  R27.8   5. Learning difficulty  F81.9   6. Medication management  Z79.899   7. Patient counseled  Z71.9   8. Goals of care, counseling/discussion  Z71.89    RECOMMENDATIONS:  Discussed recent history with patient & parent with updates for school, learning, film making,   Discussed school academic progress and recommended continued accommodations for the school year with learning support as needed.  Discussed growth and development and current weight. Recommended healthy food choices, watching portion sizes, avoiding second helpings, avoiding sugary drinks like soda and tea, drinking more water, getting more exercise.    Discussed continued need for structure, routine, reward (external), motivation (internal), positive reinforcement, consequences, and organization with school, learning, and social settings.   Encouraged recommended limitations on TV, tablets, phones, video games and computers for non-educational activities.   Discussed need for bedtime routine, use of good sleep hygiene, no video games, TV or phones for an hour before bedtime.   Encouraged physical activity and outdoor play, maintaining social distancing.   Counseled medication pharmacokinetics, options, dosage, administration, desired effects, and possible side effects.   Clonidine 0.3 mg at HS,  Prozac 20 mg daily, no Rx today Concerta 54 mg daily, # 30 with no RF's post dated for 04/30/2020. RX for above e-scribed and sent to pharmacy on record  [facility] #[id number] [name], [place] - [address] AT [facility] & [address] [place] [address] Phone: [phone] Fax: [phone]  I discussed the assessment and treatment plan with the patient & parent. The patient & parent was provided an opportunity to ask questions and all were answered. The patient & parent agreed with the plan and demonstrated an understanding of the instructions.   I provided 25 minutes of non-face-to-face time during this encounter. Completed record review for 10 minutes prior to the virtual video visit.   NEXT APPOINTMENT:  Return in about 3 months (around 07/17/2020) for f/u visit.  The patient & parent was advised to call back or seek an in-person evaluation if the symptoms worsen or if the condition fails to improve as anticipated.  Medical Decision-making: More than 50% of the appointment was spent counseling and discussing diagnosis and management of symptoms with the patient and family.  Fernando Curie, NP

## 2020-05-30 ENCOUNTER — Other Ambulatory Visit: Payer: Self-pay | Admitting: Family

## 2020-05-30 DIAGNOSIS — F411 Generalized anxiety disorder: Secondary | ICD-10-CM

## 2020-05-30 DIAGNOSIS — F900 Attention-deficit hyperactivity disorder, predominantly inattentive type: Secondary | ICD-10-CM

## 2020-05-30 MED ORDER — METHYLPHENIDATE HCL ER (OSM) 54 MG PO TBCR: 54.0000 mg | EXTENDED_RELEASE_TABLET | Freq: Every day | ORAL | 0 refills | Status: DC

## 2020-05-30 NOTE — Telephone Encounter (Signed)
Last visit 04/18/2020 next visit 06/24/2020

## 2020-05-30 NOTE — Telephone Encounter (Signed)
E-Prescribed clonidine, fluoxetine and Concerta 54 directly to  [facility]ORE #[id number] [name], [place] - [address] AT [facility] OF [facility] & [address] [place] [address] Phone: [phone] Fax: [phone]

## 2020-06-24 ENCOUNTER — Encounter: Payer: Self-pay | Admitting: Family

## 2020-06-24 ENCOUNTER — Telehealth (INDEPENDENT_AMBULATORY_CARE_PROVIDER_SITE_OTHER): Payer: 59 | Admitting: Family

## 2020-06-24 ENCOUNTER — Other Ambulatory Visit: Payer: Self-pay

## 2020-06-24 DIAGNOSIS — F840 Autistic disorder: Secondary | ICD-10-CM | POA: Diagnosis not present

## 2020-06-24 DIAGNOSIS — F900 Attention-deficit hyperactivity disorder, predominantly inattentive type: Secondary | ICD-10-CM

## 2020-06-24 DIAGNOSIS — Z719 Counseling, unspecified: Secondary | ICD-10-CM

## 2020-06-24 DIAGNOSIS — R278 Other lack of coordination: Secondary | ICD-10-CM

## 2020-06-24 DIAGNOSIS — F411 Generalized anxiety disorder: Secondary | ICD-10-CM | POA: Diagnosis not present

## 2020-06-24 DIAGNOSIS — F819 Developmental disorder of scholastic skills, unspecified: Secondary | ICD-10-CM

## 2020-06-24 DIAGNOSIS — Z7189 Other specified counseling: Secondary | ICD-10-CM

## 2020-06-24 DIAGNOSIS — Z79899 Other long term (current) drug therapy: Secondary | ICD-10-CM

## 2020-06-24 MED ORDER — METHYLPHENIDATE HCL ER (OSM) 54 MG PO TBCR
54.0000 mg | EXTENDED_RELEASE_TABLET | Freq: Every day | ORAL | 0 refills | Status: DC
Start: 2020-06-24 — End: 2020-07-25

## 2020-06-24 NOTE — Progress Notes (Signed)
[facility] [address] [place] [place] [address] Dept: [phone] Dept Fax: [phone]  Medication Check visit via Virtual Video   Patient ID:  Fernando Davies  male DOB: [birth year]   24 y.o.   MRN: [id number]   DATE:06/24/20  PCP: Eliberto Ivory, MD  Virtual Visit via Video Note  I connected with  Fernando Davies  and Fernando Davies 's Mother (Name Fernando Davies) on 06/24/20 at 10:00 AM EST by a video enabled telemedicine application and verified that I am speaking with the correct person using two identifiers. Patient/Parent Location:  At mother's work in a private room   I discussed the limitations, risks, security and privacy concerns of performing an evaluation and management service by telephone and the availability of in person appointments. I also discussed with the parents that there may be a patient responsible charge related to this service. The parents expressed understanding and agreed to proceed.  Provider: Carron Curie, NP  Location: private work location  HPI/CURRENT STATUS: Fernando Davies is here for medication management of the psychoactive medications for ADHD and review of educational and behavioral concerns.   Fernando Davies currently taking medication regimen as previous, which is working well. Takes medication as directed daily.  Fernando Davies is able to focus through his previous school work and now projects.    Fernando Davies is eating well (eating breakfast, lunch and dinner). Eating a little less now with recent head cold and mostly no changes.   Sleeping well (goes to bed at 11:00 pm wakes at 9:00 am), sleeping through the night. No problems with sleep and continuing to take his melatonin and clonidine for initiation.   EDUCATION: School: Graduation Looking at work on Designer, television/film set" after meeting with his advisor Services: IEP/504 Plan Looking for work  Activities/ Exercise:  intermittently-walking around the neighborhood, swinging on his swing set.   Screen time: (phone, tablet, TV, computer): computer for learning, movies, TV and games.   MEDICAL HISTORY: Individual Medical History/ Review of Systems: None reported recently. More sinus issues recently, had COVID-19 a few weeks ago with fever, sore throat, upper respiratory symptoms.  Family Medical/ Social History: Changes? Mother and her fiance to get married.  Patient Lives with: mother and stepfather  MENTAL HEALTH: Mental Health Issues:   Anxiety-Prozac 20 mg daily, good with symptom control.  Allergies: No Known Allergies  Current Medications:  Current Outpatient Medications  Medication Instructions  . cetirizine (ZYRTEC) 10 mg, Oral, Daily  . cloNIDine (CATAPRES) 0.3 MG tablet TAKE 1 TABLET(0.3 MG) BY MOUTH AT BEDTIME  . FLUoxetine (PROZAC) 20 MG tablet TAKE 1 TABLET(20 MG) BY MOUTH DAILY  . Melatonin 10 MG TABS 1 tablet, Oral, Daily  . methylphenidate (CONCERTA) 54 mg, Oral, Daily with breakfast  . Multiple Vitamin (MULTIVITAMIN) tablet 1 tablet, Oral, Daily   Medication Side Effects: None  DIAGNOSES:    ICD-10-CM   1. ADHD (attention deficit hyperactivity disorder), inattentive type  F90.0 methylphenidate 54 MG PO CR tablet  2. Autism spectrum disorder  F84.0   3. Generalized anxiety disorder  F41.1   4. Dysgraphia  R27.8   5. Learning difficulty  F81.9   6. Medication management  Z79.899   7. Patient counseled  Z71.9   8. Goals of care, counseling/discussion  Z71.89    ASSESSMENT: Patient doing well on his current medication regimen with no side effects and good efficacy reported. 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 Certificate received in the mail and will complete a pitch meeting for a possible job after advisor provides more information. No recent accommodatios with completion of program, but in the past has had assistance from  mother when needed to complete his online degree. No other changes reported since last visit and will continue with medication regimen as previously  PLAN/RECOMMENDATIONS:  Patient provided health care updates and recent changes since last visit on 04/18/2020.   No recent formal accommodations or modification for learning in place but was getting help from mother when needed. Has been working with mentor for her certification program for screen writing.   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 To set up a meeting for a "Pitch" to a company or person's of interest. He will meet with his advisor regarding setting up this encounter in the near future.  Encouraged recent walking in his neighborhood more with warm weather and swinging more on his swing set. Has been eating less due to URI but mostly eating the same.   Support given for continued structure and support with daily motivation.   Encouraged physical activity and outdoor play, maintaining social distancing.   Counseled medication pharmacokinetics, options, dosage, administration, desired effects, and possible side effects.   Methylphenidate 54 mg daily, # 30 with no RF's Prozac 20 mg daily no Rx today Clonidine 0.3 mg at HS, no Rx today .RX for above e-scribed and sent to pharmacy on record  [facility] DR[facility]ORE #[id number] [name], [place] - [address] AT [facility] OF [facility] & [address] [place] [address] Phone: [phone] Fax: [phone]  I discussed the assessment and treatment plan with the patient. The patient was provided an opportunity to ask questions and all were answered. The patient agreed with the plan and demonstrated an understanding of the instructions.   I provided of non-face-to-face time during this encounter. Completed record review for 10 minutes prior to the virtual video visit.   NEXT APPOINTMENT:  09/22/2020  Return  in about 3 months (around 09/24/2020) for f/u visit.  The patient/parent was advised to call back or seek an in-person evaluation if the symptoms worsen or if the condition fails to improve as anticipated.   Fernando Curie, NP

## 2020-07-25 ENCOUNTER — Other Ambulatory Visit: Payer: Self-pay | Admitting: Pediatrics

## 2020-07-25 ENCOUNTER — Telehealth: Payer: Self-pay

## 2020-07-25 DIAGNOSIS — F900 Attention-deficit hyperactivity disorder, predominantly inattentive type: Secondary | ICD-10-CM

## 2020-07-25 MED ORDER — METHYLPHENIDATE HCL ER (OSM) 54 MG PO TBCR: 54.0000 mg | EXTENDED_RELEASE_TABLET | Freq: Every day | ORAL | 0 refills | Status: DC

## 2020-07-25 NOTE — Telephone Encounter (Signed)
Mom ([name]) called in a RX refill request for Concerta. She wants it called in to [facility] on [address] Their telephone # is [phone]   E-Prescribed Concerta 54 directly to  [facility] #[id number] [name], [place] - [address] AT [facility] OF [facility] & [address] [place] [address] Phone: [phone] Fax: [phone]

## 2020-07-26 NOTE — Telephone Encounter (Signed)
Clonidine 0.3 mg at HS, # 90 with 2 RF's.RX for above e-scribed and sent to pharmacy on record  [facility] STORE #[id number] [name], [place] - [address] AT [facility] OF [facility] & [address] [place] [address] Phone: [phone] Fax: [phone]

## 2020-07-29 ENCOUNTER — Other Ambulatory Visit: Payer: Self-pay | Admitting: Pediatrics

## 2020-07-29 DIAGNOSIS — F411 Generalized anxiety disorder: Secondary | ICD-10-CM

## 2020-07-29 NOTE — Telephone Encounter (Signed)
E-Prescribed fluoxetine 20  directly to  [facility] DR[facility] STORE #[id number] [name], [place] - [address] AT [facility] OF [facility] & [address] [place] [address] Phone: [phone] Fax: [phone]  Next appt: 09/22/2020

## 2020-08-29 ENCOUNTER — Other Ambulatory Visit: Payer: Self-pay

## 2020-08-29 DIAGNOSIS — F900 Attention-deficit hyperactivity disorder, predominantly inattentive type: Secondary | ICD-10-CM

## 2020-08-29 MED ORDER — METHYLPHENIDATE HCL ER (OSM) 54 MG PO TBCR: 54.0000 mg | EXTENDED_RELEASE_TABLET | Freq: Every day | ORAL | 0 refills | Status: DC

## 2020-08-29 NOTE — Telephone Encounter (Signed)
E-Prescribed Concerta 54 directly to  [facility] #[id number] - [place], [place] - [address] AT [facility] OF [facility] & [address] [place] [address] Phone: [phone] Fax: [phone]  

## 2020-08-29 NOTE — Telephone Encounter (Signed)
Last visit 06/24/2020 next visit 09/22/2020

## 2020-09-22 ENCOUNTER — Encounter: Payer: Self-pay | Admitting: Family

## 2020-09-22 ENCOUNTER — Ambulatory Visit: Payer: 59 | Admitting: Family

## 2020-09-22 ENCOUNTER — Other Ambulatory Visit: Payer: Self-pay

## 2020-09-22 VITALS — BP 122/72 | HR 76 | Resp 16 | Ht 62.21 in | Wt 205.4 lb

## 2020-09-22 DIAGNOSIS — F819 Developmental disorder of scholastic skills, unspecified: Secondary | ICD-10-CM | POA: Diagnosis not present

## 2020-09-22 DIAGNOSIS — Z7189 Other specified counseling: Secondary | ICD-10-CM

## 2020-09-22 DIAGNOSIS — Z719 Counseling, unspecified: Secondary | ICD-10-CM

## 2020-09-22 DIAGNOSIS — F411 Generalized anxiety disorder: Secondary | ICD-10-CM

## 2020-09-22 DIAGNOSIS — R278 Other lack of coordination: Secondary | ICD-10-CM

## 2020-09-22 DIAGNOSIS — F900 Attention-deficit hyperactivity disorder, predominantly inattentive type: Secondary | ICD-10-CM

## 2020-09-22 DIAGNOSIS — F840 Autistic disorder: Secondary | ICD-10-CM

## 2020-09-22 DIAGNOSIS — Z79899 Other long term (current) drug therapy: Secondary | ICD-10-CM

## 2020-09-22 MED ORDER — METHYLPHENIDATE HCL ER (OSM) 54 MG PO TBCR: 54.0000 mg | EXTENDED_RELEASE_TABLET | Freq: Every day | ORAL | 0 refills | Status: DC

## 2020-09-22 NOTE — Progress Notes (Signed)
[facility] [address] [place] [address] Dept: [phone] Dept Fax: [phone] Loc: [phone] Loc Fax: [phone]  Medication Check  Patient ID: [name], male  DOB: 08-Jul-1995, 25 y.o.  MRN: [id number]  Date of Evaluation: 09/22/2020 PCP: Pcp, No  Accompanied by: Mother Patient Lives with: mother and stepfather  HISTORY/CURRENT STATUS: HPI Patient here with mother for the visit today. Patient interactive and appropriate with provider. 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 To decide if attending the pitch in person in [place] or zoom for possible job. Joram to continue to stay busy this summer with attending work with his mother, visiting family, swimming and possible trip to [place] later in the summer months. Has continued with medication regimen with no side effects.   EDUCATION: Looking at setting up a "pitch meeting" for June or July with travel to [place] or via zoom depending on the requirements by the company. Activities/ Exercise: intermittently-swimming recently with brother and kids at the pool. Aunt Lu for a visit in July for about a week and may take the trip [place] this summer.   MEDICAL HISTORY: Appetite: Good MVI/Other: MVI daily for men  Sleep: Bedtime: 11:00 pm  Awakens: 9:00  Concerns: Initiation/Maintenance/Other: None reported recently. Does get up earlier at 7:00 am when goes to work with mom.   Individual Medical History/ Review of Systems: Changes? :Yes COVID-19 in February.with no lasting effects.   Allergies: Patient has no known allergies.  Current Medications: Current Outpatient Medications  Medication Instructions  . cetirizine (ZYRTEC) 10 mg, Oral, Daily  . cloNIDine (CATAPRES) 0.3 MG tablet TAKE 1 TABLET(0.3 MG) BY MOUTH AT BEDTIME  . FLUoxetine (PROZAC) 20 MG  tablet TAKE 1 TABLET(20 MG) BY MOUTH DAILY  . Melatonin 10 MG TABS 1 tablet, Oral, Daily  . [START ON 09/27/2020] methylphenidate (CONCERTA) 54 mg, Oral, Daily with breakfast  . Multiple Vitamin (MULTIVITAMIN) tablet 1 tablet, Oral, Daily   Medication Side Effects: None  Family Medical/ Social History: Changes? None   MENTAL HEALTH: Mental Health Issues: Anxiety-Prozac 20 mg daily with no issues.   PHYSICAL EXAM; Vitals: Vitals:   09/22/20 1532  BP: 122/72  Pulse: 76  Resp: 16  Height: 5' 2.21" (1.58 m)  Weight: 205 lb 6.4 oz (93.2 kg)  BMI (Calculated): 37.32   General Physical Exam: Unchanged from previous exam, date:06/24/2020   DIAGNOSES:    ICD-10-CM   1. ADHD (attention deficit hyperactivity disorder), inattentive type  F90.0 methylphenidate 54 MG PO CR tablet  2. Autism spectrum disorder  F84.0   3. Generalized anxiety disorder  F41.1   4. Learning difficulty  F81.9   5. Dysgraphia  R27.8   6. Medication management  Z79.899   7. Patient counseled  Z71.9   8. Goals of care, counseling/discussion  Z71.89    ASSESSMENT: Patient doing well on current medication regimen with no side effects. Kendon reports good efficacy with his medication for anxiety and ADHD symptoms. Has possible pitch for job that will be in the next month or so via [place]oom or in [place]. Both have positives and negatives depending on the view of a potential job. Antonyo to think and talk through each situation at length before a decision is made. No other recent changes reported with health, eating or sleeping. Will continue with current medication regimen with no changes. F/u in 3 month for his  next routine visit for medication management.   RECOMMENDATIONS:  Patient provided recent updates and any change with family or medical since last visit on 06/24/2020.  Patient looking at potenetial job offers with advisor and some assistance with mentor [employer]. May consider traveling to [place] for pitch with a company  or to meet with employees via Zoom for the job. Support provided to [place].   Jewett to stay busy and active this summer with swimming and visiting family. Swinging most days and encouraged walking when able to with weather.   No changes with eating but watching daily intake to include better options. Taking a MVI daily and encouraged to drink plenty of water.   Maintaining a daily schedule and attending work with mother a few times weekly. Support provided for daily schedule and routine social interaction.   Counseled medication pharmacokinetics, options, dosage, administration, desired effects, and possible side effects.   Concerta 54 mg daily, # 30 with no RF's post dated for 09/27/2020 Clonidine 0.3 mg at HS, no Rx today Prozac 20 mg daily, no Rx today RX for above e-scribed and sent to pharmacy on record  [facility] #[id number] [name], [place] - [address] AT [facility] OF [facility] & [address] [place] [address] Phone: [phone] Fax: [phone]  I discussed the assessment and treatment plan with the patient & parent. The patient & parent was provided an opportunity to ask questions and all were answered. The patient & parent agreed with the plan and demonstrated an understanding of the instructions.  NEXT APPOINTMENT: Return in about 3 months (around 12/23/2020) for f/u visit.  Carron Curie, NP Counseling Time: 35 mins Total Contact Time: 40 mins

## 2020-10-20 ENCOUNTER — Other Ambulatory Visit: Payer: Self-pay

## 2020-10-20 ENCOUNTER — Ambulatory Visit (HOSPITAL_COMMUNITY)
Admission: EM | Admit: 2020-10-20 | Discharge: 2020-10-20 | Disposition: A | Discharge status: Home / Self Care | Payer: Managed Care, Other (non HMO) | Attending: Physician Assistant | Admitting: Physician Assistant

## 2020-10-20 ENCOUNTER — Encounter (HOSPITAL_COMMUNITY): Payer: Self-pay

## 2020-10-20 DIAGNOSIS — H60391 Other infective otitis externa, right ear: Secondary | ICD-10-CM

## 2020-10-20 MED ORDER — OFLOXACIN 0.3 % OT SOLN: 5.0000 [drp] | Freq: Two times a day (BID) | OTIC | 0 refills | Status: DC

## 2020-10-20 NOTE — ED Triage Notes (Signed)
Pt presents with right ear pain x 2 days.   Denies ringing in the ear and headache.

## 2020-10-20 NOTE — Discharge Instructions (Addendum)
Use drops as directed 

## 2020-10-20 NOTE — ED Provider Notes (Signed)
[facility]    CSN: [id number] Arrival date & time: 10/20/20  0809      History   Chief Complaint Chief Complaint  Patient presents with   Ear Pain    HPI Fernando Davies is a 25 y.o. male.   Patient here c/w R ear pain x 2 days.  Started after spending a week swimming in the ocean.  Admits otorrhea.  Denies hearing loss, tinnitus, HA, URI sx, cough, wheezing, SOB.   Past Medical History:  Diagnosis Date   ADHD (attention deficit hyperactivity disorder)    Anxiety    Autism    Family history of adverse reaction to anesthesia    MGF- difficulty waking up   Hypertension    not diagnosed    Patient Active Problem List   Diagnosis Date Noted   ADHD (attention deficit hyperactivity disorder), inattentive type 06/30/2019   Autism spectrum disorder 06/30/2019   Generalized anxiety disorder 06/30/2019   Trimalleolar fracture of right ankle 05/17/2015   Trimalleolar fracture of ankle, closed 05/17/2015    Past Surgical History:  Procedure Laterality Date   ADENOIDECTOMY     ORIF ANKLE FRACTURE Right 05/17/2015   ORIF ANKLE FRACTURE Right 05/17/2015   Procedure: OPEN REDUCTION INTERNAL FIXATION (ORIF) RIGHT ANKLE LATERAL AND MEDIAL MALLEOLUS FRACTURES;  Surgeon: Kathryne Hitch, MD;  Location: [facility];  Service: Orthopedics;  Laterality: Right;   TYMPANOSTOMY TUBE PLACEMENT     x3       Home Medications    Prior to Admission medications   Medication Sig Start Date End Date Taking? Authorizing Provider  FLUoxetine (PROZAC) 20 MG tablet TAKE 1 TABLET(20 MG) BY MOUTH DAILY 07/29/20  Yes Dedlow, Ether Griffins, NP  Multiple Vitamin (MULTIVITAMIN) tablet Take 1 tablet by mouth daily.   Yes [provider]  ofloxacin (FLOXIN) 0.3 % OTIC solution Place 5 drops into the right ear 2 (two) times daily. 10/20/20  Yes Evern Core, PA-C  cetirizine (ZYRTEC) 10 MG tablet Take 10 mg by mouth daily.    [provider]  cloNIDine (CATAPRES) 0.3 MG tablet  TAKE 1 TABLET(0.3 MG) BY MOUTH AT BEDTIME 07/26/20   Paretta-Leahey, Dawn M, NP  Melatonin 10 MG TABS Take 1 tablet by mouth daily.    [provider]  methylphenidate 54 MG PO CR tablet Take 1 tablet (54 mg total) by mouth daily with breakfast. 09/27/20   Paretta-Leahey, Miachel Roux, NP    Family History History reviewed. No pertinent family history.  Social History Social History   Tobacco Use   Smoking status: Never   Smokeless tobacco: Never  Substance Use Topics   Alcohol use: No   Drug use: No     Allergies   Patient has no known allergies.   Review of Systems Review of Systems  Constitutional:  Negative for chills, fatigue and fever.  HENT:  Positive for ear discharge and ear pain. Negative for congestion, facial swelling, hearing loss, nosebleeds, postnasal drip, rhinorrhea, sinus pressure, sinus pain and sore throat.   Eyes:  Negative for pain and redness.  Respiratory:  Negative for cough, shortness of breath and wheezing.   Gastrointestinal:  Negative for abdominal pain, diarrhea, nausea and vomiting.  Musculoskeletal:  Negative for arthralgias and myalgias.  Skin:  Negative for rash.  Neurological:  Negative for light-headedness and headaches.  Hematological:  Negative for adenopathy. Does not bruise/bleed easily.  Psychiatric/Behavioral:  Negative for confusion and sleep disturbance.     Physical Exam Triage  Vital Signs ED Triage Vitals  Enc Vitals Group     BP 10/20/20 0820 136/90     Pulse Rate 10/20/20 0820 97     Resp 10/20/20 0820 20     Temp 10/20/20 0821 98.7 F (37.1 C)     Temp Source 10/20/20 0821 Oral     SpO2 10/20/20 0820 98 %     Weight --      Height --      Head Circumference --      Peak Flow --      Pain Score 10/20/20 0818 3     Pain Loc --      Pain Edu? --      Excl. in GC? --    No data found.  Updated Vital Signs BP 136/90 (BP Location: Right Arm)   Pulse 97   Temp 98.7 F (37.1 C) (Oral)   Resp 20   SpO2 98%    Visual Acuity Right Eye Distance:   Left Eye Distance:   Bilateral Distance:    Right Eye Near:   Left Eye Near:    Bilateral Near:     Physical Exam Vitals and nursing note reviewed.  Constitutional:      General: He is not in acute distress.    Appearance: Normal appearance. He is well-developed. He is not ill-appearing.  HENT:     Head: Normocephalic and atraumatic.     Right Ear: Tympanic membrane and ear canal normal. Drainage, swelling and tenderness present. No foreign body. Tympanic membrane is not injected, erythematous or retracted.     Left Ear: Tympanic membrane and ear canal normal.     Nose: Nose normal. No congestion or rhinorrhea.     Mouth/Throat:     Mouth: Mucous membranes are moist.     Pharynx: No oropharyngeal exudate or posterior oropharyngeal erythema.  Eyes:     General: No scleral icterus.    Extraocular Movements: Extraocular movements intact.     Conjunctiva/sclera: Conjunctivae normal.  Cardiovascular:     Rate and Rhythm: Normal rate and regular rhythm.     Heart sounds: No murmur heard. Pulmonary:     Effort: Pulmonary effort is normal. No respiratory distress.     Breath sounds: Normal breath sounds. No wheezing or rales.  Musculoskeletal:     Cervical back: Normal range of motion and neck supple. No rigidity.  Lymphadenopathy:     Cervical: No cervical adenopathy.  Skin:    General: Skin is warm and dry.     Coloration: Skin is not jaundiced.     Findings: No rash.  Neurological:     General: No focal deficit present.     Mental Status: He is alert and oriented to person, place, and time.     Motor: No weakness.     Gait: Gait normal.  Psychiatric:        Mood and Affect: Mood normal.        Behavior: Behavior normal.     UC Treatments / Results  Labs (all labs ordered are listed, but only abnormal results are displayed) Labs Reviewed - No data to display  EKG   Radiology No results found.  Procedures Procedures  (including critical care time)  Medications Ordered in UC Medications - No data to display  Initial Impression / Assessment and Plan / UC Course  I have reviewed the triage vital signs and the nursing notes.  Pertinent labs & imaging results that were available during  my care of the patient were reviewed by me and considered in my medical decision making (see chart for details).     Use drops as directed Final Clinical Impressions(s) / UC Diagnoses   Final diagnoses:  Infective otitis externa of right ear     Discharge Instructions      Use drops as directed     ED Prescriptions     Medication Sig Dispense Auth. Provider   ofloxacin (FLOXIN) 0.3 % OTIC solution Place 5 drops into the right ear 2 (two) times daily. 5 mL Evern Core, PA-C      PDMP not reviewed this encounter.   Evern Core, PA-C 10/20/20 [phone]

## 2020-10-31 ENCOUNTER — Other Ambulatory Visit: Payer: Self-pay

## 2020-10-31 DIAGNOSIS — F900 Attention-deficit hyperactivity disorder, predominantly inattentive type: Secondary | ICD-10-CM

## 2020-10-31 MED ORDER — METHYLPHENIDATE HCL ER (OSM) 54 MG PO TBCR: 54.0000 mg | EXTENDED_RELEASE_TABLET | Freq: Every day | ORAL | 0 refills | Status: DC

## 2020-10-31 NOTE — Telephone Encounter (Signed)
Concerta 54 mg daily, # 30 with no RF's.RX for above e-scribed and sent to pharmacy on record  [facility] #[id number] [name], [place] - [address] AT [facility] OF [facility] & [address] [place] [address] Phone: [phone] Fax: [phone]

## 2020-11-17 ENCOUNTER — Other Ambulatory Visit: Payer: Self-pay | Admitting: Pediatrics

## 2020-11-17 DIAGNOSIS — F411 Generalized anxiety disorder: Secondary | ICD-10-CM

## 2020-11-17 NOTE — Telephone Encounter (Signed)
Prozac 20 mg daily, # 30 with no RF's.RX for above e-scribed and sent to pharmacy on record  [facility] #[id number] [name], [place] - [address] AT [facility] OF [facility] & [address] [place] [address] Phone: [phone] Fax: [phone]

## 2020-11-30 ENCOUNTER — Other Ambulatory Visit: Payer: Self-pay

## 2020-11-30 ENCOUNTER — Telehealth: Payer: Self-pay

## 2020-11-30 DIAGNOSIS — F900 Attention-deficit hyperactivity disorder, predominantly inattentive type: Secondary | ICD-10-CM

## 2020-11-30 MED ORDER — METHYLPHENIDATE HCL ER (OSM) 54 MG PO TBCR: 54.0000 mg | EXTENDED_RELEASE_TABLET | Freq: Every day | ORAL | 0 refills | Status: DC

## 2020-11-30 NOTE — Telephone Encounter (Signed)
Outcome  Approvedtoday  Your PA request has been approved. Additional information will be provided in the approval communication. (Message 1145)

## 2020-11-30 NOTE — Telephone Encounter (Signed)
Concerta 54 mg daily, # 30 with no RF's.RX for above e-scribed and sent to pharmacy on record  [facility][id number] - [place], [place] - [address] AT [facility] OF [address] & [address] [place] [address] Phone: [phone] Fax: [phone]   

## 2020-12-28 ENCOUNTER — Other Ambulatory Visit: Payer: Self-pay

## 2020-12-28 DIAGNOSIS — F900 Attention-deficit hyperactivity disorder, predominantly inattentive type: Secondary | ICD-10-CM

## 2020-12-28 MED ORDER — METHYLPHENIDATE HCL ER (OSM) 54 MG PO TBCR
54.0000 mg | EXTENDED_RELEASE_TABLET | Freq: Every day | ORAL | 0 refills | Status: DC
Start: 2020-12-28 — End: 2020-12-30

## 2020-12-28 NOTE — Telephone Encounter (Signed)
Concerta 54 mg daily, # 30 with no RF's.RX for above e-scribed and sent to pharmacy on record  [id number] - [place], [place] - [address] AT [facility] OF [address] [place] [address] Phone: [phone] Fax: [phone]   

## 2020-12-30 MED ORDER — METHYLPHENIDATE HCL ER (OSM) 54 MG PO TBCR: 54.0000 mg | EXTENDED_RELEASE_TABLET | Freq: Every day | ORAL | 0 refills | Status: DC

## 2020-12-30 NOTE — Addendum Note (Signed)
Addended by: Burgess Estelle on: 12/30/2020 03:36 PM   Modules accepted: Orders

## 2020-12-30 NOTE — Addendum Note (Signed)
Addended by: Elvera Maria R on: 12/30/2020 05:00 PM   Modules accepted: Orders

## 2020-12-30 NOTE — Telephone Encounter (Signed)
E-Prescribed Concerta 54 directly to  [facility] #[id number] - [name], [place] - [address] AT [facility] OF [facility] [address] & [facility] [address] [name] [address] Phone: [phone] Fax: [phone]

## 2020-12-30 NOTE — Telephone Encounter (Signed)
Mom called in needing to switch pharm. Send RX to [facility] on [address]

## 2021-01-20 ENCOUNTER — Other Ambulatory Visit: Payer: Self-pay

## 2021-01-20 ENCOUNTER — Encounter: Payer: Self-pay | Admitting: Family

## 2021-01-20 ENCOUNTER — Telehealth (INDEPENDENT_AMBULATORY_CARE_PROVIDER_SITE_OTHER): Payer: 59 | Admitting: Family

## 2021-01-20 DIAGNOSIS — Z719 Counseling, unspecified: Secondary | ICD-10-CM

## 2021-01-20 DIAGNOSIS — R278 Other lack of coordination: Secondary | ICD-10-CM

## 2021-01-20 DIAGNOSIS — F840 Autistic disorder: Secondary | ICD-10-CM | POA: Diagnosis not present

## 2021-01-20 DIAGNOSIS — Z79899 Other long term (current) drug therapy: Secondary | ICD-10-CM

## 2021-01-20 DIAGNOSIS — F900 Attention-deficit hyperactivity disorder, predominantly inattentive type: Secondary | ICD-10-CM | POA: Diagnosis not present

## 2021-01-20 DIAGNOSIS — Z7189 Other specified counseling: Secondary | ICD-10-CM

## 2021-01-20 DIAGNOSIS — F411 Generalized anxiety disorder: Secondary | ICD-10-CM | POA: Diagnosis not present

## 2021-01-20 DIAGNOSIS — F819 Developmental disorder of scholastic skills, unspecified: Secondary | ICD-10-CM

## 2021-01-20 MED ORDER — METHYLPHENIDATE HCL ER (OSM) 54 MG PO TBCR: 54.0000 mg | EXTENDED_RELEASE_TABLET | Freq: Every day | ORAL | 0 refills | Status: DC

## 2021-01-20 NOTE — Progress Notes (Signed)
[facility] [address] [place] [place] [address] Dept: [phone] Dept Fax: [phone]  Medication Check visit via Virtual Video   Patient ID:  Fernando Davies  male DOB: March 18, 1996   25 y.o.   MRN: [id number]   DATE:01/20/21  PCP: Pcp, No  Virtual Visit via Video Note  I connected with  Fernando Davies  and Fernando Davies 's Mother (Name [name]) on 01/20/21 at  8:00 AM EDT by a video enabled telemedicine application and verified that I am speaking with the correct person using two identifiers. Patient/Parent Location: at home   I discussed the limitations, risks, security and privacy concerns of performing an evaluation and management service by telephone and the availability of in person appointments. I also discussed with the parents that there may be a patient responsible charge related to this service. The parents expressed understanding and agreed to proceed.  Provider: Carron Curie, NP  Location: private work location  HPI/CURRENT STATUS: Fernando Davies is here for medication management of the psychoactive medications for ADHD and review of educational and behavioral concerns.   Fernando Davies currently taking his medication regimen, which is working well. Takes medication daily as instructed. Medication tends to wear off around evening time for his stimulant. Fernando Davies is able to focus through any tasks.    Fernando Davies is eating well (eating breakfast, lunch and dinner). Eating well with no changes.   Sleeping well (getting plenty of sleep), sleeping through the night. Clonidine 0.3 mg at HS with 10 mg of Melatonin.   EDUCATION/WORK: Recently traveled to [place] and pitched his script to Mr. [name] and received an 1 1/2 hour of advice.   Activities/ Exercise: intermittently walking in the neighborhood, and swinging.  Screen time: (phone, tablet, TV, computer): computer, phone, TV and movies.    MEDICAL HISTORY: Individual Medical History/ Review of Systems: None reported. Dentist for next visit.   Family Medical/ Social History: Changes? Mother remarried Patient Lives with: mother and stepfather  MENTAL HEALTH: Mental Health Issues:  Anxiety- Prozac is controlling his current symptoms.   Allergies: No Known Allergies  Current Medications:  Current Outpatient Medications  Medication Instructions   cetirizine (ZYRTEC) 10 mg, Oral, Daily   cloNIDine (CATAPRES) 0.3 MG tablet TAKE 1 TABLET(0.3 MG) BY MOUTH AT BEDTIME   FLUoxetine (PROZAC) 20 MG tablet TAKE 1 TABLET(20 MG) BY MOUTH DAILY   Melatonin 10 MG TABS 1 tablet, Oral, Daily   [START ON 01/27/2021] methylphenidate (CONCERTA) 54 mg, Oral, Daily with breakfast   Multiple Vitamin (MULTIVITAMIN) tablet 1 tablet, Oral, Daily   ofloxacin (FLOXIN) 0.3 % OTIC solution 5 drops, Right EAR, 2 times daily   Medication Side Effects: None  DIAGNOSES:    ICD-10-CM   1. ADHD (attention deficit hyperactivity disorder), inattentive type  F90.0 methylphenidate 54 MG PO CR tablet    2. Autism spectrum disorder  F84.0     3. Generalized anxiety disorder  F41.1     4. Learning difficulty  F81.9     5. Dysgraphia  R27.8     6. Medication management  Z79.899     7. Patient counseled  Z71.9     8. Goals of care, counseling/discussion  Z71.89      ASSESSMENT: Fernando Davies is a 25 year old male with a history of ADHD, ASD, Learning difficulties and Anxiety. He has been well controlled on his current medication regimen with no side effects. Has recently traveled  to [place] 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 [name] and was given suggestion for his script. Spent 5 days touring and sight seeing in [place]. Had also taken a trip to [place] and went to [employer] parks with the family. No significant changes reported in health since the last f/u visit. Needing to establish a PCP due to some GI issues. Eating with some concerns related to GI  difficulties recently.  No changes with sleeping habits. Medication to remain the same for symptom control of his ADHD and Anxiety. F/u routinely in 3 months.   PLAN/RECOMMENDATIONS:  Patient and mother provided updates with completion of movie pitch, travel to [place], trip to [place], health and medication.  Reviewed recent pitch in [place] with Mr. [name] along with suggestion given with good feed back for his script.     Discussed eating and getting a variety of foods with no current concerns. Exercise/activity with walking and swinging almost daily.  Health updates with trying establish with PCP and needing GI referral for some concerns.   Continuing for structure and routine with home and attending work with mother.   Discussed limitation on electronic devices and only using for about 2 hours each day.   Good sleep hygiene and bedtime routine with no video games, TV or phones for an hour before bedtime. Using melatonin and Clonidine at HS with no concerns.   Counseled medication pharmacokinetics, options, dosage, administration, desired effects, and possible side effects.   Concerta 54 mg daily, # 30 with no RF's Prozac 20 mg daily, no Rx today Clonidine 0.3 mg daily no Rx today RX for above e-scribed and sent to pharmacy on record  [facility] #[id number] [name], [place] - [address] AT [facility] OF [facility] & [address] [place] [address] Phone: [phone] Fax: [phone]  I discussed the assessment and treatment plan with the patient & parent. The patient & parent was provided an opportunity to ask questions and all were answered. The patient & parent agreed with the plan and demonstrated an understanding of the instructions.   I provided 25 minutes of non-face-to-face time during this encounter. Completed record review for 10 minutes prior to the virtual video visit.   NEXT APPOINTMENT:  04/14/2021  Return in about 3 months (around  04/21/2021) for f/u visit.  The patient/parent was advised to call back or seek an in-person evaluation if the symptoms worsen or if the condition fails to improve as anticipated.   Carron Curie, NP

## 2021-02-24 ENCOUNTER — Other Ambulatory Visit: Payer: Self-pay | Admitting: Family

## 2021-02-24 DIAGNOSIS — F411 Generalized anxiety disorder: Secondary | ICD-10-CM

## 2021-02-24 DIAGNOSIS — F900 Attention-deficit hyperactivity disorder, predominantly inattentive type: Secondary | ICD-10-CM

## 2021-02-24 MED ORDER — METHYLPHENIDATE HCL ER (OSM) 54 MG PO TBCR: 54.0000 mg | EXTENDED_RELEASE_TABLET | Freq: Every day | ORAL | 0 refills | Status: DC

## 2021-02-24 NOTE — Telephone Encounter (Signed)
E-Prescribed Concerta 54 and fluoxetine 20 directly to  [facility] #[id number] [name],  - [address] AT [facility] OF [facility] & [address] [place] [address] Phone: [phone] Fax: [phone]

## 2021-03-02 MED ORDER — METHYLPHENIDATE HCL ER (OSM) 54 MG PO TBCR: 54.0000 mg | EXTENDED_RELEASE_TABLET | Freq: Every day | ORAL | 0 refills | Status: DC

## 2021-03-02 NOTE — Telephone Encounter (Signed)
RX for above e-scribed and sent to pharmacy on record  [id number] - [place], [place] - [address] AT [address] OF [address] & [address] [address] [place] [address] Phone: [phone] Fax: [phone] 

## 2021-03-02 NOTE — Addendum Note (Signed)
Addended by: Maximiano Lott A on: 03/02/2021 01:46 PM   Modules accepted: Orders

## 2021-03-02 NOTE — Addendum Note (Signed)
Addended by: Burgess Estelle on: 03/02/2021 08:40 AM   Modules accepted: Orders

## 2021-03-02 NOTE — Telephone Encounter (Signed)
[facility] on [address] does not have Concerta in stock mom would like it ent to [facility] on [address]

## 2021-03-13 ENCOUNTER — Other Ambulatory Visit: Payer: Self-pay

## 2021-03-13 DIAGNOSIS — F900 Attention-deficit hyperactivity disorder, predominantly inattentive type: Secondary | ICD-10-CM

## 2021-03-28 ENCOUNTER — Other Ambulatory Visit: Payer: Self-pay

## 2021-03-28 DIAGNOSIS — F900 Attention-deficit hyperactivity disorder, predominantly inattentive type: Secondary | ICD-10-CM

## 2021-03-29 MED ORDER — METHYLPHENIDATE HCL ER (OSM) 54 MG PO TBCR: 54.0000 mg | EXTENDED_RELEASE_TABLET | Freq: Every day | ORAL | 0 refills | Status: DC

## 2021-03-29 NOTE — Telephone Encounter (Signed)
Concerta 54 mg daily # 30 with no RF's.Fernando Davies for above e-scribed and sent to pharmacy on record  [facility] #[id number] [name], [place] - [address] AT [facility] OF [facility] & [address] [place] [address] Phone: [phone] Fax: [phone]

## 2021-03-31 NOTE — Telephone Encounter (Signed)
[facility] does not have Concerta in stock mom would like [facility] to [facility] on [address]

## 2021-04-03 MED ORDER — METHYLPHENIDATE HCL ER (OSM) 54 MG PO TBCR: 54.0000 mg | EXTENDED_RELEASE_TABLET | Freq: Every day | ORAL | 0 refills | Status: DC

## 2021-04-03 NOTE — Addendum Note (Signed)
Addended by: Carron Curie on: 04/03/2021 07:45 AM   Modules accepted: Orders

## 2021-04-03 NOTE — Telephone Encounter (Signed)
Resent Rx to different pharmacy for Concerta 54 mg daily, # 30 with no RF's.RX for above e-scribed and sent to pharmacy on record  [phone] [name], [place] - [address] [place] [address] Phone: [phone] Fax: [phone]

## 2021-04-14 ENCOUNTER — Telehealth (INDEPENDENT_AMBULATORY_CARE_PROVIDER_SITE_OTHER): Payer: 59 | Admitting: Family

## 2021-04-14 ENCOUNTER — Other Ambulatory Visit: Payer: Self-pay

## 2021-04-14 ENCOUNTER — Encounter: Payer: Self-pay | Admitting: Family

## 2021-04-14 DIAGNOSIS — F900 Attention-deficit hyperactivity disorder, predominantly inattentive type: Secondary | ICD-10-CM | POA: Diagnosis not present

## 2021-04-14 DIAGNOSIS — Z719 Counseling, unspecified: Secondary | ICD-10-CM

## 2021-04-14 DIAGNOSIS — Z7189 Other specified counseling: Secondary | ICD-10-CM

## 2021-04-14 DIAGNOSIS — Z72821 Inadequate sleep hygiene: Secondary | ICD-10-CM

## 2021-04-14 DIAGNOSIS — Z79899 Other long term (current) drug therapy: Secondary | ICD-10-CM

## 2021-04-14 DIAGNOSIS — F840 Autistic disorder: Secondary | ICD-10-CM

## 2021-04-14 DIAGNOSIS — F411 Generalized anxiety disorder: Secondary | ICD-10-CM | POA: Diagnosis not present

## 2021-04-14 NOTE — Progress Notes (Signed)
[facility] [address] [place]. [address] [place] [address] Dept: [phone] Dept Fax: [phone]  Medication Check visit via Virtual Video   Patient ID:  Fernando Davies  male DOB: [birth year]   25 y.o.   MRN: [id number]   DATE:04/14/21  PCP: Pcp, No  Virtual Visit via Video Note  I connected with  Fernando Davies  and Fernando Davies 's Mother (Name Fernando Davies) on 04/14/21 at  8:00 AM EST by a video enabled telemedicine application and verified that I am speaking with the correct person using two identifiers. Patient/Parent Location: at home   I discussed the limitations, risks, security and privacy concerns of performing an evaluation and management service by telephone and the availability of in person appointments. I also discussed with the parents that there may be a patient responsible charge related to this service. The parents expressed understanding and agreed to proceed.  Provider: Carron Curie, NP  Location: private work location  HPI/CURRENT STATUS: Fernando Davies is here for medication management of the psychoactive medications for ADHD and review of educational and behavioral concerns.   Fernando Davies currently taking his medication regimen, which is working well. Takes medication daily as directed. Medication tends to last for the time needed. Fernando Davies is able to focus through school and chores.   Fernando Davies is eating well (eating breakfast, lunch and dinner). Fernando Davies does not have appetite suppression   Sleeping well (getting plenty of sleep), sleeping through the night. Fernando Davies does not have delayed sleep onset and using Clonidine for school along with Melatonin.   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 Nothing has resulted from this and will contact [employer] after the holidays.  Activities/ Exercise: intermittently-walking on occasion and talking  about getting a new dog.   MEDICAL HISTORY: Individual Medical History/ Review of Systems: None recently  Has been healthy with no visits to the PCP. WCC due yearly for routine care.   Family Medical/ Social History: Changes? None Patient Lives with: mother and stepfather  MENTAL HEALTH: Mental Health Issues: Anxiety-Prozac 20 mg with good symptom control  Allergies: No Known Allergies  Current Medications:  Current Outpatient Medications  Medication Instructions   cetirizine (ZYRTEC) 10 mg, Oral, Daily   cloNIDine (CATAPRES) 0.3 MG tablet TAKE 1 TABLET(0.3 MG) BY MOUTH AT BEDTIME   FLUoxetine (PROZAC) 20 MG tablet TAKE 1 TABLET(20 MG) BY MOUTH DAILY   Melatonin 10 MG TABS 1 tablet, Oral, Daily   methylphenidate (CONCERTA) 54 mg, Oral, Daily with breakfast   Multiple Vitamin (MULTIVITAMIN) tablet 1 tablet, Oral, Daily   ofloxacin (FLOXIN) 0.3 % OTIC solution 5 drops, Right EAR, 2 times daily   Medication Side Effects: None  DIAGNOSES:    ICD-10-CM   1. ADHD (attention deficit hyperactivity disorder), inattentive type  F90.0     2. Autism spectrum disorder  F84.0     3. Generalized anxiety disorder  F41.1     4. Medication management  Z79.899     5. Patient counseled  Z71.9     6. Goals of care, counseling/discussion  Z71.89     7. History of difficulty sleeping  Z72.821      ASSESSMENT:  Fernando Davies is a 25 year old male with a history of ADHD, ASD, anxiety and learning difficulties. He has been well maintained on current medication regimen of Concerta and Prozac during the day. Not currently work or in any  academic program. After the holidays will start searching for a part-time position. Getting plenty of sleep, eating with no concerns and some activity when the weather is nice outside. Will continue with the same medication and dosing.   PLAN/RECOMMENDATIONS:  Updates for home, health, progress with his movie script pitch and changes since last visit.  At home or with  mother at her office during the week. Trying to keep a daily routine.  Discussed history of movie pitch in [place] and to follow up with the gentleman for any updates.   Starting to search for work after the holidays with local companies. Suggestions and recommendations provided.  Structure and routine discussed with patient for home and work place with mother.   Sleep hygiene and bedtime routine discussed with patient and mother. Melatonin and Clonidine used for sleep initiation with success.   Counseled medication pharmacokinetics, options, dosage, administration, desired effects, and possible side effects.    Counseled medication pharmacokinetics, options, dosage, administration, desired effects, and possible side effects.   Concerta 54 mg daily, no Rx today Prozac 20 mg daily, no Rx today Clonidine 0.3 mg daily no Rx today   I discussed the assessment and treatment plan with the patient & parent. The patient & parent was provided an opportunity to ask questions and all were answered. The patient & parent agreed with the plan and demonstrated an understanding of the instructions.   NEXT APPOINTMENT:  07/07/2021-f/u visit scheduled Telehealth OK  The patient & parent was advised to call back or seek an in-person evaluation if the symptoms worsen or if the condition fails to improve as anticipated.   Carron Curie, NP

## 2021-04-25 ENCOUNTER — Other Ambulatory Visit: Payer: Self-pay | Admitting: Family

## 2021-04-25 DIAGNOSIS — F900 Attention-deficit hyperactivity disorder, predominantly inattentive type: Secondary | ICD-10-CM

## 2021-04-25 NOTE — Telephone Encounter (Signed)
Clonidine 0.3 mg at HS, # 90 with 2 RF's.RX for above e-scribed and sent to pharmacy on record  [id number] - [place], [place] - [address] AT [facility] OF [address] [place] [address] Phone: [phone] Fax: [phone]    

## 2021-05-01 ENCOUNTER — Other Ambulatory Visit: Payer: Self-pay

## 2021-05-01 DIAGNOSIS — F900 Attention-deficit hyperactivity disorder, predominantly inattentive type: Secondary | ICD-10-CM

## 2021-05-01 DIAGNOSIS — F411 Generalized anxiety disorder: Secondary | ICD-10-CM

## 2021-05-01 MED ORDER — METHYLPHENIDATE HCL ER (OSM) 54 MG PO TBCR: 54.0000 mg | EXTENDED_RELEASE_TABLET | Freq: Every day | ORAL | 0 refills | Status: DC

## 2021-05-01 MED ORDER — FLUOXETINE HCL 20 MG PO TABS: ORAL_TABLET | ORAL | 2 refills | Status: DC

## 2021-05-01 NOTE — Telephone Encounter (Signed)
Concerta 54 mg daily, # 30 with no RF's and PRozac 20 mg daily, # 30 with 2 RF's.RX for above e-scribed and sent to pharmacy on record  [facility] [phone] [name], [place] - [address] [place] [address] Phone: [phone] Fax: [phone]

## 2021-05-25 ENCOUNTER — Other Ambulatory Visit: Payer: Self-pay | Admitting: Pediatrics

## 2021-05-25 DIAGNOSIS — F411 Generalized anxiety disorder: Secondary | ICD-10-CM

## 2021-05-25 NOTE — Telephone Encounter (Signed)
Prozac 20 mg daily, # 30 with 2 RF's.RX for above e-scribed and sent to pharmacy on record  [facility] #[id number] [name], [place] - [address] AT [facility] OF [facility] & [address] [place] [address] Phone: [phone] Fax: [phone]

## 2021-05-26 ENCOUNTER — Other Ambulatory Visit: Payer: Self-pay

## 2021-05-26 DIAGNOSIS — F900 Attention-deficit hyperactivity disorder, predominantly inattentive type: Secondary | ICD-10-CM

## 2021-05-26 MED ORDER — METHYLPHENIDATE HCL ER (OSM) 54 MG PO TBCR: 54.0000 mg | EXTENDED_RELEASE_TABLET | Freq: Every day | ORAL | 0 refills | Status: DC

## 2021-05-26 NOTE — Telephone Encounter (Signed)
Concerta 54 mg daily, #30 with no RF's.RX for above e-scribed and sent to pharmacy on record  [id number] - [place], [place] - [address] [place] [address] Phone: [phone] Fax: [phone]     

## 2021-06-22 ENCOUNTER — Other Ambulatory Visit: Payer: Self-pay

## 2021-06-22 DIAGNOSIS — F900 Attention-deficit hyperactivity disorder, predominantly inattentive type: Secondary | ICD-10-CM

## 2021-06-23 MED ORDER — METHYLPHENIDATE HCL ER (OSM) 54 MG PO TBCR: 54.0000 mg | EXTENDED_RELEASE_TABLET | Freq: Every day | ORAL | 0 refills | Status: DC

## 2021-06-23 NOTE — Telephone Encounter (Signed)
Concerta 54 mg daily, #30 with no RF's.RX for above e-scribed and sent to pharmacy on record  [id number] - [place], [place] - [address] [place] [address] Phone: [phone] Fax: [phone]     

## 2021-07-07 ENCOUNTER — Other Ambulatory Visit: Payer: Self-pay

## 2021-07-07 ENCOUNTER — Encounter: Payer: Self-pay | Admitting: Family

## 2021-07-07 ENCOUNTER — Telehealth (INDEPENDENT_AMBULATORY_CARE_PROVIDER_SITE_OTHER): Payer: 59 | Admitting: Family

## 2021-07-07 DIAGNOSIS — Z7189 Other specified counseling: Secondary | ICD-10-CM

## 2021-07-07 DIAGNOSIS — F900 Attention-deficit hyperactivity disorder, predominantly inattentive type: Secondary | ICD-10-CM | POA: Diagnosis not present

## 2021-07-07 DIAGNOSIS — F411 Generalized anxiety disorder: Secondary | ICD-10-CM | POA: Diagnosis not present

## 2021-07-07 DIAGNOSIS — R278 Other lack of coordination: Secondary | ICD-10-CM

## 2021-07-07 DIAGNOSIS — Z719 Counseling, unspecified: Secondary | ICD-10-CM

## 2021-07-07 DIAGNOSIS — F840 Autistic disorder: Secondary | ICD-10-CM

## 2021-07-07 DIAGNOSIS — F819 Developmental disorder of scholastic skills, unspecified: Secondary | ICD-10-CM

## 2021-07-07 DIAGNOSIS — Z79899 Other long term (current) drug therapy: Secondary | ICD-10-CM

## 2021-07-07 NOTE — Progress Notes (Signed)
**Note Fernando-Identified via Obfuscation** ?[place] [facility] ?[place] ?[place] [address] ?Dept: [phone] ?Dept Fax: [phone] ? ?Medication Check visit via Virtual Video  ? ?Patient ID:  Fernando Davies  male DOB: 05/17/1995   25 y.o.   MRN: [id number]  ? ?DATE:07/07/21 ? ?PCP: Pcp, No ? ?Virtual Visit via Video Note ? ?I connected with  Fernando Davies  and Fernando Davies 's Mother (Name Fernando Davies) on 07/07/21 at  8:00 AM EDT by a video enabled telemedicine application and verified that I am speaking with the correct person using two identifiers. Patient/Parent Location: at home ?  ?I discussed the limitations, risks, security and privacy concerns of performing an evaluation and management service by telephone and the availability of in person appointments. I also discussed with the parents that there may be a patient responsible charge related to this service. The parents expressed understanding and agreed to proceed. ? ?Provider: Carolann Littler, NP  Location: private work location  ? ?HPI/CURRENT STATUS: ?Fernando Davies is here for medication management of the psychoactive medications for ADHD and review of educational and behavioral concerns.  ? ?Fernando Davies currently taking Concerta 54 mg daily, which is working well. Takes medication daily in the morning. Medication tends to wear off around evening. Fernando Davies is able to focus throughout the day.   ? ?Fernando Davies is eating well (eating breakfast, lunch and dinner). Fernando Davies does not have appetite suppression with some changes of eating less with different times.  ? ?Sleeping well (goes to bed at 11:00 pm wakes at 9:00 am), sleeping through the night. Fernando Davies does not have delayed sleep onset with no concerns. Waking on occasion to use the bathroom most night but falls back to sleep. ? ?EDUCATION/WORK: ?Work: Looking at work options and may be applying for seasonal work.  ? ?Activities/ Exercise: intermittently walking  around the neighborhood. Looking at other indoor options of a bike. ? ?MEDICAL HISTORY: ?Individual Medical History/ Review of Systems: None  Has been healthy with no visits to the PCP. Page due needs yearly visit. .  ? ?Family Medical/ Social History: Changes? None reported ?Patient Lives with: mother and stepfather ? ?MENTAL HEALTH: ?Mental Health Issues: Anxiety-less with medications   ? ?Allergies: ?No Known Allergies ? ?Current Medications:  ?Current Outpatient Medications  ?Medication Instructions  ? cetirizine (ZYRTEC) 10 mg, Oral, Daily  ? cloNIDine (CATAPRES) 0.3 MG tablet TAKE 1 TABLET(0.3 MG) BY MOUTH AT BEDTIME  ? FLUoxetine (PROZAC) 20 MG tablet TAKE 1 TABLET(20 MG) BY MOUTH DAILY  ? Melatonin 10 MG TABS 1 tablet, Oral, Daily  ? methylphenidate (CONCERTA) 54 mg, Oral, Daily with breakfast  ? Multiple Vitamin (MULTIVITAMIN) tablet 1 tablet, Oral, Daily  ? ofloxacin (FLOXIN) 0.3 % OTIC solution 5 drops, Right EAR, 2 times daily  ? ?Medication Side Effects: None ? ?DIAGNOSES:  ?  ICD-10-CM   ?1. ADHD (attention deficit hyperactivity disorder), inattentive type  F90.0   ?  ?2. Autism spectrum disorder  F84.0   ?  ?3. Generalized anxiety disorder  F41.1   ?  ?4. Medication management  Z79.899   ?  ?5. Learning difficulty  F81.9   ?  ?6. Dysgraphia  R27.8   ?  ?7. Patient counseled  Z71.9   ?  ?8. Goals of care, counseling/discussion  Z71.89   ?  ? ?ASSESSMENT:      ?Fernando Davies is a 26 year old male with a history of ADHD, ASD, Anxiety, and learning  difficulties. He has been well maintained on Concerta 54 mg daily along with Prozac 20 mg for his anxiety. Not currently working, but is looking for applications for work in [place]. Going to work with the mother on occasion to get out of the house. Getting plenty of sleep each night with Clonidine at HS. Eating with no concerns and trying to not eat as late in the evening time before bed. Some activity with walking in the neighborhood when weather is nice and looking  at a indoor bike. NO changes in healthcare in the past few months. To continue with current medication regimen with no changes.  ? ?PLAN/RECOMMENDATIONS:  ?Discussed any changes with jobs, applications, health and family over the past few months. ? ?Looking at work options for the summer and closer to mother's work in [place]. ? ?Keeping a daily routine and 2 days/week going to work with mother. ? ?Structure and routine discussed regarding home and work place activities.  ? ?Some physical activity with walking and looking to get a inside bike to ride for more exercise. ? ?Eating with no significant changes, but did discuss eating his after dinner snack earlier than just before bedtime.  ? ?Sleep schedule and sleep hygiene discussed with patient and parent. Good sleep schedule that has been consistent with no problems. ? ?Counseled medication pharmacokinetics, options, dosage, administration, desired effects, and possible side effects.   ?Prozac 20 mg daily, no Rx today ?Clonidine 0.3 mg at HS, no Rx today ?Concerta 54 mg daily, post dated for 07/14/2021, # 30 with no RF's ?RX for above e-scribed and sent to pharmacy on record ? ?[id number] [place] [place] ?[place] ?[place] [address] ?Phone: [phone] Fax: [phone] ? ?I discussed the assessment and treatment plan with the patient & parent. The patient & parent was provided an opportunity to ask questions and all were answered. The patient & parent agreed with the plan and demonstrated an understanding of the instructions. ?  ?NEXT APPOINTMENT:  ?09/27/2021-f/u visit  ?Telehealth OK ? ?The patient & parent was advised to call back or seek an in-person evaluation if the symptoms worsen or if the condition fails to improve as anticipated. ? ?Carolann Littler, NP ? ?

## 2021-07-25 ENCOUNTER — Telehealth: Payer: Self-pay | Admitting: Family

## 2021-07-25 DIAGNOSIS — F900 Attention-deficit hyperactivity disorder, predominantly inattentive type: Secondary | ICD-10-CM

## 2021-07-25 MED ORDER — METHYLPHENIDATE HCL ER (OSM) 54 MG PO TBCR: 54.0000 mg | EXTENDED_RELEASE_TABLET | Freq: Every day | ORAL | 0 refills | Status: DC

## 2021-07-25 NOTE — Telephone Encounter (Signed)
Concerta 54 mg daily, #30 with no RF's.RX for above e-scribed and sent to pharmacy on record  [id number] [place], [place] - [address] [place] [address] Phone: [phone] Fax: [phone]     

## 2021-07-25 NOTE — Telephone Encounter (Signed)
Mom called for refill for M[facility] ?

## 2021-08-24 ENCOUNTER — Other Ambulatory Visit: Payer: Self-pay

## 2021-08-24 DIAGNOSIS — F900 Attention-deficit hyperactivity disorder, predominantly inattentive type: Secondary | ICD-10-CM

## 2021-08-24 MED ORDER — METHYLPHENIDATE HCL ER (OSM) 54 MG PO TBCR: 54.0000 mg | EXTENDED_RELEASE_TABLET | Freq: Every day | ORAL | 0 refills | Status: DC

## 2021-08-24 NOTE — Telephone Encounter (Signed)
Concerta 54 mg daily # 30 with no RF's.RX for above e-scribed and sent to pharmacy on record  [id number] - [place], [place] - [address] [place] [address] Phone: [phone] Fax: [phone]     

## 2021-09-22 ENCOUNTER — Other Ambulatory Visit: Payer: Self-pay

## 2021-09-22 DIAGNOSIS — F900 Attention-deficit hyperactivity disorder, predominantly inattentive type: Secondary | ICD-10-CM

## 2021-09-22 DIAGNOSIS — F411 Generalized anxiety disorder: Secondary | ICD-10-CM

## 2021-09-22 MED ORDER — METHYLPHENIDATE HCL ER (OSM) 54 MG PO TBCR: 54.0000 mg | EXTENDED_RELEASE_TABLET | Freq: Every day | ORAL | 0 refills | Status: DC

## 2021-09-22 MED ORDER — FLUOXETINE HCL 20 MG PO TABS: 20.0000 mg | ORAL_TABLET | Freq: Every day | ORAL | 2 refills | Status: DC

## 2021-09-22 NOTE — Telephone Encounter (Signed)
Prozac 20 mg daiy # 30 with 2 RF's and Concerta 54 mg daily, # 30 with no RF's.RX for above e-scribed and sent to pharmacy on record  [facility] [phone] [name], [place] - [address] [place] [address] Phone: [phone] Fax: [phone]

## 2021-09-27 ENCOUNTER — Encounter: Payer: Self-pay | Admitting: Family

## 2021-09-27 ENCOUNTER — Ambulatory Visit: Payer: 59 | Admitting: Family

## 2021-09-27 VITALS — BP 118/72 | Resp 16 | Ht 62.6 in | Wt 208.2 lb

## 2021-09-27 DIAGNOSIS — F900 Attention-deficit hyperactivity disorder, predominantly inattentive type: Secondary | ICD-10-CM

## 2021-09-27 DIAGNOSIS — Z79899 Other long term (current) drug therapy: Secondary | ICD-10-CM

## 2021-09-27 DIAGNOSIS — R278 Other lack of coordination: Secondary | ICD-10-CM

## 2021-09-27 DIAGNOSIS — F411 Generalized anxiety disorder: Secondary | ICD-10-CM

## 2021-09-27 DIAGNOSIS — F840 Autistic disorder: Secondary | ICD-10-CM

## 2021-09-27 DIAGNOSIS — Z7189 Other specified counseling: Secondary | ICD-10-CM

## 2021-09-27 DIAGNOSIS — Z719 Counseling, unspecified: Secondary | ICD-10-CM

## 2021-09-27 DIAGNOSIS — F819 Developmental disorder of scholastic skills, unspecified: Secondary | ICD-10-CM

## 2021-09-27 NOTE — Progress Notes (Addendum)
[facility] [address] [place] [address] Dept: [phone] Dept Fax: [phone] Loc: [phone] Loc Fax: [phone]  Medication Check  Patient ID: Fernando Davies, male  DOB: [birth year], 26 y.o.  MRN: [id number]  Date of Evaluation: 09/27/2021 PCP: Pcp, No  Accompanied by:  self Patient Lives with: mother and stepfather  HISTORY/CURRENT STATUS: HPISamuel was here for the visit with his mother but she remained in the waiting room during the visit. Fernando Davies was interactive and appropriate during the visit. No reported changes with heath or medications since last visit on 07/07/2021. Fernando Davies has no concerns or issues with his current medication regimen.  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   Activities/ Exercise:  new exercise bike recently and peddling 5 mins daily with recent malfunction.  Plans to visit aunt in July for a week in [place] [facility] [facility] [place] [facility] [place] for activities.   MEDICAL HISTORY: Appetite: Good and drinking more Hawaiian punch  MVI/Other: MVI daily May consider some new foods.  Sleep: Bedtime: 11:00 pm and some time falls asleep shortly after getting into bed.  Awakens: 7:00 am to go to work with mother, 9:00 am when at home  Concerns: Initiation/Maintenance/Other: No issues. Melatonin at HS and Clonidine 0.3 mg at HS.   Individual Medical History/ Review of Systems: Changes? :None reported recently. Dentist with no cavities and to establish care with PCP.   Allergies: Patient has no known allergies.  Current Medications:  Current Outpatient Medications  Medication Instructions   cetirizine (ZYRTEC) 10 mg, Oral, Daily   cloNIDine (CATAPRES) 0.3 MG tablet TAKE 1 TABLET(0.3 MG) BY MOUTH AT BEDTIME   FLUoxetine (PROZAC) 20 mg, Oral, Daily   Melatonin 10 MG TABS 1 tablet, Oral,  Daily   methylphenidate (CONCERTA) 54 mg, Oral, Daily with breakfast   Multiple Vitamin (MULTIVITAMIN) tablet 1 tablet, Oral, Daily   Medication Side Effects: None  Family Medical/ Social History: Changes? Yes GF with surgery recently.   MENTAL HEALTH: Mental Health Issues: Anxiety-No issues with current medication and assist with symptom control.   PHYSICAL EXAM; Vitals: There were no vitals taken for this visit.  General Physical Exam: Unchanged from previous exam, date:07/07/21 Changed:None  DIAGNOSES:    ICD-10-CM   1. ADHD (attention deficit hyperactivity disorder), inattentive type  F90.0     2. Generalized anxiety disorder  F41.1     3. Autism spectrum disorder  F84.0     4. Dysgraphia  R27.8     5. Learning difficulty  F81.9     6. Patient counseled  Z71.9     7. Medication management  Z79.899     8. Goals of care, counseling/discussion  Z71.89      ASSESSMENT: Fernando Davies is a 26 year old male with a history of ADHD, ASD, learning difficulties and Anxiety. He has been  maintained on Concerta, Prozac and Clonidine with good efficacy and not side effects. Fernando Davies has continued to attend work with his mother 2 days/week, watching anticipated movies to review, and working on his scripts. He has continued with support for searching for part time work and volunteer work by his mother. No medical changes in the past 3 month. Some activity reported with new bike, Eating with no changes, but attempting new foods. Sleeping well with continued use of Clonidine.  No medication changes needed today.  RECOMMENDATIONS:  Updates on job  searching, script writing, volunteer work and other opportunities with [employer] today.  Has continue with watching movies to critique them and writing often.   Attending mother's work about 2 days/week has continued with a daily routine.   Discussed medical updates, family changes, and healthy lifestyle over the past few months.  Reviewed eating a  healthy variety of foods and discusses some options with Fernando Davies.   Exercise daily with updates on new bike that needs repair, but was using for 5 mns daily.  Summer trip to his aunt's house for the beach this summer.   Sleep schedule has continued with no concerns and is consistent with hours of sleep nightly.   Medication management discussed with no changes needed today.   Counseled medication pharmacokinetics, options, dosage, administration, desired effects, and possible side effects.   Prozac 20 mg daily, no Rx today Clonidine 0.3 mg at HS, no Rx today Concerta 54 mg daily, no Rx today   I discussed the assessment and treatment plan with the patient & parent. The patient & parent was provided an opportunity to ask questions and all were answered. The patient & parent agreed with the plan and demonstrated an understanding of the instructions.  NEXT APPOINTMENT: Return in about 3 months (around 12/28/2021) for f/u visit .  The patient & parent was advised to call back or seek an in-person evaluation if the symptoms worsen or if the condition fails to improve as anticipated.  Carron Curie, NP

## 2021-10-01 ENCOUNTER — Encounter: Payer: Self-pay | Admitting: Family

## 2021-10-20 ENCOUNTER — Telehealth: Payer: Self-pay | Admitting: Family

## 2021-10-20 DIAGNOSIS — F900 Attention-deficit hyperactivity disorder, predominantly inattentive type: Secondary | ICD-10-CM

## 2021-10-20 MED ORDER — METHYLPHENIDATE HCL ER (OSM) 54 MG PO TBCR: 54.0000 mg | EXTENDED_RELEASE_TABLET | Freq: Every day | ORAL | 0 refills | Status: DC

## 2021-10-20 NOTE — Telephone Encounter (Signed)
Concerta 54 mg daily, #30 with no RF's.RX for above e-scribed and sent to pharmacy on record  [id number] [place], [place] - [address] [place] [address] Phone: [phone] Fax: [phone]     

## 2021-10-20 NOTE — Telephone Encounter (Signed)
Mom called in for refill for Methylphenidate 54mg  to be sent to [facility] pharmacy.

## 2021-10-25 ENCOUNTER — Telehealth: Payer: Self-pay

## 2021-10-30 NOTE — Telephone Encounter (Signed)
Outcome Approvedtoday Effective from 10/30/2021 through 10/29/2022.

## 2021-11-01 DIAGNOSIS — H6091 Unspecified otitis externa, right ear: Secondary | ICD-10-CM | POA: Diagnosis not present

## 2021-11-23 ENCOUNTER — Other Ambulatory Visit: Payer: Self-pay

## 2021-11-23 DIAGNOSIS — F900 Attention-deficit hyperactivity disorder, predominantly inattentive type: Secondary | ICD-10-CM

## 2021-11-23 NOTE — Telephone Encounter (Signed)
Mom called in stating that it would save her 50$ if we send in the caps of the Prozac instead of the tab

## 2021-11-24 MED ORDER — METHYLPHENIDATE HCL ER (OSM) 54 MG PO TBCR: 54.0000 mg | EXTENDED_RELEASE_TABLET | Freq: Every day | ORAL | 0 refills | Status: DC

## 2021-11-24 NOTE — Telephone Encounter (Signed)
Concerta 54 mg daily # 30 with no RF's.RX for above e-scribed and sent to pharmacy on record  [phone] [name], [place] - [address] [place] [address] Phone: [phone] Fax: [phone]

## 2021-12-01 ENCOUNTER — Other Ambulatory Visit: Payer: Self-pay

## 2021-12-01 MED ORDER — FLUOXETINE HCL 20 MG PO CAPS: 20.0000 mg | ORAL_CAPSULE | Freq: Every day | ORAL | 2 refills | Status: DC

## 2021-12-01 NOTE — Telephone Encounter (Signed)
Prozac 20 mg change to capsules, # 30 with 2 RF's.RX for above e-scribed and sent to pharmacy on record  [facility] [phone] [name], [place] - [address] [place] [address] Phone: [phone] Fax: [phone]

## 2021-12-01 NOTE — Telephone Encounter (Signed)
Prozac change from tab to cap

## 2021-12-09 ENCOUNTER — Ambulatory Visit (HOSPITAL_COMMUNITY)
Admission: EM | Admit: 2021-12-09 | Discharge: 2021-12-09 | Disposition: A | Discharge status: Home / Self Care | Payer: Self-pay

## 2021-12-18 ENCOUNTER — Telehealth (INDEPENDENT_AMBULATORY_CARE_PROVIDER_SITE_OTHER): Payer: BC Managed Care – PPO | Admitting: Family

## 2021-12-18 ENCOUNTER — Encounter: Payer: Self-pay | Admitting: Family

## 2021-12-18 DIAGNOSIS — Z79899 Other long term (current) drug therapy: Secondary | ICD-10-CM

## 2021-12-18 DIAGNOSIS — F900 Attention-deficit hyperactivity disorder, predominantly inattentive type: Secondary | ICD-10-CM

## 2021-12-18 DIAGNOSIS — Z7189 Other specified counseling: Secondary | ICD-10-CM

## 2021-12-18 DIAGNOSIS — F840 Autistic disorder: Secondary | ICD-10-CM

## 2021-12-18 DIAGNOSIS — F819 Developmental disorder of scholastic skills, unspecified: Secondary | ICD-10-CM

## 2021-12-18 DIAGNOSIS — F411 Generalized anxiety disorder: Secondary | ICD-10-CM | POA: Diagnosis not present

## 2021-12-18 DIAGNOSIS — Z72821 Inadequate sleep hygiene: Secondary | ICD-10-CM

## 2021-12-18 DIAGNOSIS — Z719 Counseling, unspecified: Secondary | ICD-10-CM

## 2021-12-18 DIAGNOSIS — R278 Other lack of coordination: Secondary | ICD-10-CM | POA: Diagnosis not present

## 2021-12-18 MED ORDER — CLONIDINE HCL 0.3 MG PO TABS: ORAL_TABLET | ORAL | 2 refills | Status: DC

## 2021-12-18 NOTE — Progress Notes (Signed)
[facility] [address] [place] [place] [address] Dept: [phone] Dept Fax: [phone]  Medication Check visit via Virtual Video   Patient ID:  Fernando Davies  male DOB: 12-22-1995   26 y.o.   MRN: [id number]   DATE:12/18/21  PCP: Pcp, No  Virtual Visit via Video Note  I connected with  Fernando Davies  and Fernando Davies 's Mother (Name Fernando Davies) on 12/18/21 at  9:00 AM EDT by a video enabled telemedicine application and verified that I am speaking with the correct person using two identifiers. Patient/Parent Location: work with his mother  I discussed the limitations, risks, security and privacy concerns of performing an evaluation and management service by telephone and the availability of in person appointments. I also discussed with the parents that there may be a patient responsible charge related to this service. The parents expressed understanding and agreed to proceed.  Provider: Carron Curie, NP  Location: private work location  HPI/CURRENT STATUS: Fernando Davies is here for medication management of the psychoactive medications for ADHD and review of educational and behavioral concerns.   Fernando Davies currently taking Concerta 54 mg and Prozac 20 mg daily, which is working well. Takes medication at 0900 am. Medication tends to wear off around evening time. Fernando Davies is able to focus through work.   Fernando Davies is eating well (eating breakfast, lunch and dinner). Fernando Davies does not have appetite suppression and trying to limit late eating at night.   Sleeping well (goes to bed at 2300 wakes at 0900), sleeping through the night. Fernando Davies does not have delayed sleep onset and using Clonidine 0.3 mg with Melatonin at HS with good results. Taking medication at 2200.  EDUCATION/WORK: Fernando Davies liked his script and concept with potential of putting the script into action. Guiding with script writing  and now looking at artist for poster for his storyline.   Activities/ Exercise: intermittently  MEDICAL HISTORY: Individual Medical History/ Review of Systems: AOM with swimmer's ear recently and Abx for treatment. Has been healthy with no visits to the PCP. WCC due yearly.   Family Medical/ Social History: Brother left for [name] for the next year for school with study abroad.  Patient Lives with: mother and stepfather  MENTAL HEALTH: Mental Health Issues:   Anxiety with good symptoms control with his Prozac dose.   Allergies: No Known Allergies  Current Medications:  Current Outpatient Medications on File Prior to Visit  Medication Sig Dispense Refill   cetirizine (ZYRTEC) 10 MG tablet Take 10 mg by mouth daily.     FLUoxetine (PROZAC) 20 MG capsule Take 1 capsule (20 mg total) by mouth daily. 30 capsule 2   Melatonin 10 MG TABS Take 1 tablet by mouth daily.     methylphenidate 54 MG PO CR tablet Take 1 tablet (54 mg total) by mouth daily with breakfast. 30 tablet 0   Multiple Vitamin (MULTIVITAMIN) tablet Take 1 tablet by mouth daily.     No current facility-administered medications on file prior to visit.   Medication Side Effects: None  DIAGNOSES:    ICD-10-CM   1. ADHD (attention deficit hyperactivity disorder), inattentive type  F90.0 cloNIDine (CATAPRES) 0.3 MG tablet    2. Autism spectrum disorder  F84.0     3. Generalized anxiety disorder  F41.1     4. Dysgraphia  R27.8     5. Learning difficulty  F81.9     6. Medication management  Z79.899     7. Patient counseled  Z71.9     8. History of difficulty sleeping  Z72.821     9. Goals of care, counseling/discussion  Z71.89      ASSESSMENT:      Cranston is a 26 year old male with a history of ADHD, ASD and Anxiety. He has been maintained on Concerta, Prozac and Clonidine with good efficacy reported without side effects. Has has been working on his script that was reviewed by Fernando. Fernando Davies in [place] and guiding him  through steps of recognition for possible production. Needing an Tree surgeon for poster drawing to assist with advertising his storyline. Stayed busy this summer with vacation to his Aunt Fernando Davies's and swimming. Walking the dog and riding his exercise bike. Eating better with not eating late at night. No health changes reported. Sleeping well with current dose of Clonidine and Melatonin. No medication changes needed.   PLAN/RECOMMENDATIONS:  Updates with his script and support given for continued progress with working toward advertisement.  Not actively seeking work or volunteer position due to progression with his written script.   Health updates with pending f/u with new PCP at the end of September.   Eating was reviewed for daily intake with limiting his night time intake before bedtime.   Encouraged physical activity with walking the dog and riding the exercise bike.  Sleep schedule has been consistent with no recent concerns. Will continued with Clonidine and Melatonin as previously.   Counseled medication pharmacokinetics, options, dosage, administration, desired effects, and possible side effects.   Concerta 54 mg daily, no Rx today Prozac 20 mg daily, no Rx today Clonidine 0.3 mg at HS, # 90 with 2 RF's.RX for above e-scribed and sent to pharmacy on record  [phone] [name], [place] - [address] [place] [address] Phone: [phone] Fax: [phone]   I discussed the assessment and treatment plan with the patient & parent. The patient & parent was provided an opportunity to ask questions and all were answered. The patient & parent agreed with the plan and demonstrated an understanding of the instructions.   NEXT APPOINTMENT:  03/16/2022-f/u visit  Telehealth OK  The patient & parent was advised to call back or seek an in-person evaluation if the symptoms worsen or if the condition fails to improve as anticipated.  Carron Curie,  NP

## 2021-12-22 ENCOUNTER — Telehealth: Payer: 59 | Admitting: Family

## 2021-12-24 ENCOUNTER — Other Ambulatory Visit: Payer: Self-pay | Admitting: Family

## 2021-12-26 NOTE — Telephone Encounter (Signed)
Prozac 20 mg daily, # 90 with no RF's.RX for above e-scribed and sent to pharmacy on record  [phone] [name], [place] - [address] [place] [address] Phone: [phone] Fax: [phone]

## 2021-12-28 ENCOUNTER — Other Ambulatory Visit: Payer: Self-pay

## 2021-12-28 DIAGNOSIS — F900 Attention-deficit hyperactivity disorder, predominantly inattentive type: Secondary | ICD-10-CM

## 2021-12-28 MED ORDER — METHYLPHENIDATE HCL ER (OSM) 54 MG PO TBCR: 54.0000 mg | EXTENDED_RELEASE_TABLET | Freq: Every day | ORAL | 0 refills | Status: DC

## 2021-12-28 NOTE — Telephone Encounter (Signed)
Concerta 54 mg daily, #30 with no RF's.RX for above e-scribed and sent to pharmacy on record  [id number] - [place], [place] - [address] [place] [address] Phone: [phone] Fax: [phone]     

## 2022-01-11 DIAGNOSIS — F900 Attention-deficit hyperactivity disorder, predominantly inattentive type: Secondary | ICD-10-CM | POA: Diagnosis not present

## 2022-01-11 DIAGNOSIS — Z0000 Encounter for general adult medical examination without abnormal findings: Secondary | ICD-10-CM | POA: Diagnosis not present

## 2022-01-11 DIAGNOSIS — R030 Elevated blood-pressure reading, without diagnosis of hypertension: Secondary | ICD-10-CM | POA: Diagnosis not present

## 2022-01-11 DIAGNOSIS — Z13220 Encounter for screening for lipoid disorders: Secondary | ICD-10-CM | POA: Diagnosis not present

## 2022-01-11 DIAGNOSIS — F840 Autistic disorder: Secondary | ICD-10-CM | POA: Diagnosis not present

## 2022-01-11 DIAGNOSIS — F411 Generalized anxiety disorder: Secondary | ICD-10-CM | POA: Diagnosis not present

## 2022-01-22 ENCOUNTER — Other Ambulatory Visit: Payer: Self-pay

## 2022-01-22 DIAGNOSIS — F900 Attention-deficit hyperactivity disorder, predominantly inattentive type: Secondary | ICD-10-CM

## 2022-01-22 MED ORDER — METHYLPHENIDATE HCL ER (OSM) 54 MG PO TBCR: 54.0000 mg | EXTENDED_RELEASE_TABLET | Freq: Every day | ORAL | 0 refills | Status: DC

## 2022-01-22 NOTE — Telephone Encounter (Signed)
Concerta 54 mg daily, #30 with no RF's.RX for above e-scribed and sent to pharmacy on record  [id number] - [place], [place] - [address] [place] [address] Phone: [phone] Fax: [phone]     

## 2022-02-19 ENCOUNTER — Other Ambulatory Visit: Payer: Self-pay

## 2022-02-19 DIAGNOSIS — F900 Attention-deficit hyperactivity disorder, predominantly inattentive type: Secondary | ICD-10-CM

## 2022-02-19 MED ORDER — METHYLPHENIDATE HCL ER (OSM) 54 MG PO TBCR: 54.0000 mg | EXTENDED_RELEASE_TABLET | Freq: Every day | ORAL | 0 refills | Status: DC

## 2022-02-19 NOTE — Telephone Encounter (Signed)
Concerta 54 mg daily, #30 with no RF's.RX for above e-scribed and sent to pharmacy on record  [id number] - [place], [place] - [address] [place] [address] Phone: [phone] Fax: [phone]     

## 2022-03-16 ENCOUNTER — Telehealth (INDEPENDENT_AMBULATORY_CARE_PROVIDER_SITE_OTHER): Payer: BC Managed Care – PPO | Admitting: Family

## 2022-03-16 ENCOUNTER — Encounter: Payer: Self-pay | Admitting: Family

## 2022-03-16 DIAGNOSIS — F819 Developmental disorder of scholastic skills, unspecified: Secondary | ICD-10-CM | POA: Diagnosis not present

## 2022-03-16 DIAGNOSIS — F900 Attention-deficit hyperactivity disorder, predominantly inattentive type: Secondary | ICD-10-CM

## 2022-03-16 DIAGNOSIS — F840 Autistic disorder: Secondary | ICD-10-CM | POA: Diagnosis not present

## 2022-03-16 DIAGNOSIS — Z719 Counseling, unspecified: Secondary | ICD-10-CM

## 2022-03-16 DIAGNOSIS — Z7189 Other specified counseling: Secondary | ICD-10-CM

## 2022-03-16 DIAGNOSIS — R278 Other lack of coordination: Secondary | ICD-10-CM

## 2022-03-16 DIAGNOSIS — Z79899 Other long term (current) drug therapy: Secondary | ICD-10-CM

## 2022-03-16 DIAGNOSIS — F411 Generalized anxiety disorder: Secondary | ICD-10-CM

## 2022-03-16 MED ORDER — METHYLPHENIDATE HCL ER (OSM) 54 MG PO TBCR: 54.0000 mg | EXTENDED_RELEASE_TABLET | Freq: Every day | ORAL | 0 refills | Status: DC

## 2022-03-16 NOTE — Progress Notes (Signed)
[facility] [address] [place]. [address] [place] [address] Dept: [phone] Dept Fax: [phone]  Medication Check visit via Virtual Video   Patient ID:  Fernando Davies  male DOB: Oct 22, 1995   26 y.o.   MRN: [id number]   DATE:03/16/22  PCP: Pcp, No  Virtual Visit via Video Note I connected with  Clancey  and [name] 's Mother (Name [name]) on 03/16/22 at 10:00 AM EST by a video enabled telemedicine application and verified that I am speaking with the correct person using two identifiers. Patient/Parent Location: at home  I discussed the limitations, risks, security and privacy concerns of performing an evaluation and management service by telephone and the availability of in person appointments. I also discussed with the parents that there may be a patient responsible charge related to this service. The parents expressed understanding and agreed to proceed.  Provider: Carron Curie, NP  Location: private work location  HPI/CURRENT STATUS: Terran is here for medication management of the psychoactive medications for ADHD and review of educational and behavioral concerns.   Dayvian currently taking Concerta 54 mg daily  which is working well. Takes medication at breakfast time. Medication tends to wear off around evening time. Chon is able to focus through homework.   North is eating well (eating breakfast, lunch and dinner). Colbe does not have appetite suppression and better with water intake daily with watching his eating.   Sleeping well (goes to bed at 2300 wakes at 0900), sleeping through the night. Garik does not have delayed sleep onset and taking his Clonidine with Melatonin at HS, waking on occasion to use the bathroom.   EDUCATION/WORK:   Activities/ Exercise:  exercise bike for 5 minutes daily with arms and legs and walking  MEDICAL HISTORY: Individual Medical History/ Review  of Systems: Yes, new PCP for establishing care. Watching HTN and monitoring daily with f/u needed.   Has been healthy with no visits to the PCP. WCC due yearly with new PCP and f/u visit.   Family Medical/ Social History: None reported.  Bellamy Lives with: mother and stepfather  MENTAL HEALTH: Mental Health Issues:   Anxiety    Allergies: No Known Allergies  Current Medications:  Current Outpatient Medications on File Prior to Visit  Medication Sig Dispense Refill   cetirizine (ZYRTEC) 10 MG tablet Take 10 mg by mouth daily.     cloNIDine (CATAPRES) 0.3 MG tablet TAKE 1 TABLET(0.3 MG) BY MOUTH AT BEDTIME 90 tablet 2   FLUoxetine (PROZAC) 20 MG capsule TAKE 1 CAPSULE BY MOUTH EVERY DAY 90 capsule 1   Melatonin 10 MG TABS Take 1 tablet by mouth daily.     Multiple Vitamin (MULTIVITAMIN) tablet Take 1 tablet by mouth daily.     No current facility-administered medications on file prior to visit.   Medication Side Effects: None  DIAGNOSES:    ICD-10-CM   1. ADHD (attention deficit hyperactivity disorder), inattentive type  F90.0 methylphenidate 54 MG PO CR tablet    2. Autism spectrum disorder  F84.0     3. Generalized anxiety disorder  F41.1     4. Learning difficulty  F81.9     5. Dysgraphia  R27.8     6. Medication management  Z79.899     7. Patient counseled  Z71.9     8. Goals of care, counseling/discussion  Z71.89     ASSESSMENT:     Fernando Davies is a 26 year  old male with a history of ADHD, Anxiety, L/D and Dysgraphia. He has been maintained on Concerta 54 mg daily with his Prozac 20 mg daily with efficacy reported.  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 Attempting to contact individuals for assistance with drawings. Working out daily on his workout bike using arms and legs for 5 mins along with walking. More adventurous with trying new foods recently and cutting back on soda with more water intake daily. Anxiety has been  under control with recent issues with continued Prozac daily. Recently established with new PCP and concerns with HTN with regular monitoring recommended. Sleep habits have continued with daily routine and use of Clonidine and Melatonin. No changes needed today with medications or dosing.   PLAN/RECOMMENDATIONS:  Discussed recent updates with home and any changes since last f/u visit.  Working on drawings/sketches for his script that he would like to have published.  To contact a previous Well[employer] geologist to assist with some drawings/sketches for his script to be published.   Exercising more with using his exercise bike for at least 5 minutes daily and walking.   Eating some new foods and trying more variety of foods. Limiting intake of sodas and more water with flavoring.  Attempting to cook more and bake with mother at home.   Recent PCP visit to establish care and concerns with HTN discussed.  Good sleep habits discussed with a nightly routine for adequate sleep.   Medication management and regimen with current medications with good efficacy.   Counseled medication pharmacokinetics, options, dosage, administration, desired effects, and possible side effects.   Concerta 54 mg daily, #30 with no RF's.RX for above e-scribed and sent to pharmacy on record  [id number] [name], [place] - [address] [place] [address] Phone: [phone] Fax: [phone]  I discussed the assessment and treatment plan with [name] & parent. [name] & parent was provided an opportunity to ask questions and all were answered. [name] & parent agreed with the plan and demonstrated an understanding of the instructions.  REVIEW OF CHART, FACE TO FACE CLINIC TIME AND DOCUMENTATION TIME DURING TODAY'S VISIT:  32 mins      NEXT APPOINTMENT:  07/02/2022-f/u visit Telehealth OK  The patient & parent was advised to call back or seek an in-person evaluation if the symptoms worsen  or if the condition fails to improve as anticipated.   Carron Curie, NP

## 2022-03-23 ENCOUNTER — Telehealth: Payer: 59 | Admitting: Family

## 2022-04-25 ENCOUNTER — Other Ambulatory Visit: Payer: Self-pay

## 2022-04-25 DIAGNOSIS — F900 Attention-deficit hyperactivity disorder, predominantly inattentive type: Secondary | ICD-10-CM

## 2022-04-26 MED ORDER — METHYLPHENIDATE HCL ER (OSM) 54 MG PO TBCR: 54.0000 mg | EXTENDED_RELEASE_TABLET | Freq: Every day | ORAL | 0 refills | Status: DC

## 2022-04-26 NOTE — Telephone Encounter (Signed)
Concerta 54 mg daily, #08 with no RF's.RX for above e-scribed and sent to pharmacy on record  [id number] - [place], [place] - [address] [place] [address] Phone: [phone] Fax: [phone]

## 2022-05-25 ENCOUNTER — Other Ambulatory Visit: Payer: Self-pay

## 2022-05-25 DIAGNOSIS — F900 Attention-deficit hyperactivity disorder, predominantly inattentive type: Secondary | ICD-10-CM

## 2022-05-25 MED ORDER — METHYLPHENIDATE HCL ER (OSM) 54 MG PO TBCR: 54.0000 mg | EXTENDED_RELEASE_TABLET | Freq: Every day | ORAL | 0 refills | Status: AC

## 2022-05-25 MED ORDER — CLONIDINE HCL 0.3 MG PO TABS
ORAL_TABLET | ORAL | 2 refills | Status: AC
Start: 2022-05-25 — End: ?

## 2022-05-25 MED ORDER — FLUOXETINE HCL 20 MG PO CAPS: 20.0000 mg | ORAL_CAPSULE | Freq: Every day | ORAL | 1 refills | Status: AC

## 2022-05-25 NOTE — Telephone Encounter (Signed)
RX for above e-scribed and sent to pharmacy on record  [id number] - [place], [place] - [address] [place] [address] Phone: [phone] Fax: [phone] 

## 2022-05-30 ENCOUNTER — Other Ambulatory Visit: Payer: Self-pay

## 2022-05-30 MED ORDER — METHYLPHENIDATE HCL ER (OSM) 54 MG PO TBCR: 54.0000 mg | EXTENDED_RELEASE_TABLET | Freq: Every day | ORAL | 0 refills | Status: AC

## 2022-05-30 MED ORDER — METHYLPHENIDATE HCL ER (OSM) 54 MG PO TBCR: 54.0000 mg | EXTENDED_RELEASE_TABLET | ORAL | 0 refills | Status: AC

## 2022-05-30 NOTE — Telephone Encounter (Signed)
Concerta 54 mg daily, #89 with on RF's.Posted dated 3 Rx's for 06/23/22, 07/24/22, & 08/23/22. RX for above e-scribed and sent to pharmacy on record  [id number] - [place], [place] - [address] [place] [address] Phone: [phone] Fax: [phone]

## 2022-07-02 ENCOUNTER — Telehealth: Payer: 59 | Admitting: Family

## 2022-09-25 ENCOUNTER — Institutional Professional Consult (permissible substitution): Payer: 59 | Admitting: Family

## 2022-10-03 ENCOUNTER — Institutional Professional Consult (permissible substitution): Payer: 59 | Admitting: Family

## 2022-10-11 DIAGNOSIS — F900 Attention-deficit hyperactivity disorder, predominantly inattentive type: Secondary | ICD-10-CM | POA: Diagnosis not present

## 2022-10-11 DIAGNOSIS — F411 Generalized anxiety disorder: Secondary | ICD-10-CM | POA: Diagnosis not present

## 2022-10-11 DIAGNOSIS — F8181 Disorder of written expression: Secondary | ICD-10-CM | POA: Diagnosis not present

## 2023-01-07 DIAGNOSIS — F819 Developmental disorder of scholastic skills, unspecified: Secondary | ICD-10-CM | POA: Diagnosis not present

## 2023-01-07 DIAGNOSIS — F900 Attention-deficit hyperactivity disorder, predominantly inattentive type: Secondary | ICD-10-CM | POA: Diagnosis not present

## 2023-01-07 DIAGNOSIS — F8181 Disorder of written expression: Secondary | ICD-10-CM | POA: Diagnosis not present

## 2023-01-07 DIAGNOSIS — F411 Generalized anxiety disorder: Secondary | ICD-10-CM | POA: Diagnosis not present

## 2023-03-27 DIAGNOSIS — F411 Generalized anxiety disorder: Secondary | ICD-10-CM | POA: Diagnosis not present

## 2023-03-27 DIAGNOSIS — F8181 Disorder of written expression: Secondary | ICD-10-CM | POA: Diagnosis not present

## 2023-03-27 DIAGNOSIS — F900 Attention-deficit hyperactivity disorder, predominantly inattentive type: Secondary | ICD-10-CM | POA: Diagnosis not present

## 2023-06-27 DIAGNOSIS — F900 Attention-deficit hyperactivity disorder, predominantly inattentive type: Secondary | ICD-10-CM | POA: Diagnosis not present

## 2023-06-27 DIAGNOSIS — F411 Generalized anxiety disorder: Secondary | ICD-10-CM | POA: Diagnosis not present

## 2023-06-27 DIAGNOSIS — F8181 Disorder of written expression: Secondary | ICD-10-CM | POA: Diagnosis not present

## 2023-09-24 DIAGNOSIS — F900 Attention-deficit hyperactivity disorder, predominantly inattentive type: Secondary | ICD-10-CM | POA: Diagnosis not present

## 2023-09-24 DIAGNOSIS — F411 Generalized anxiety disorder: Secondary | ICD-10-CM | POA: Diagnosis not present

## 2023-09-24 DIAGNOSIS — F8181 Disorder of written expression: Secondary | ICD-10-CM | POA: Diagnosis not present

## 2024-01-02 DIAGNOSIS — F8181 Disorder of written expression: Secondary | ICD-10-CM | POA: Diagnosis not present

## 2024-01-02 DIAGNOSIS — F411 Generalized anxiety disorder: Secondary | ICD-10-CM | POA: Diagnosis not present

## 2024-01-02 DIAGNOSIS — F900 Attention-deficit hyperactivity disorder, predominantly inattentive type: Secondary | ICD-10-CM | POA: Diagnosis not present

## 2024-04-02 DIAGNOSIS — F900 Attention-deficit hyperactivity disorder, predominantly inattentive type: Secondary | ICD-10-CM | POA: Diagnosis not present

## 2024-04-02 DIAGNOSIS — F411 Generalized anxiety disorder: Secondary | ICD-10-CM | POA: Diagnosis not present

## 2024-04-02 DIAGNOSIS — F8181 Disorder of written expression: Secondary | ICD-10-CM | POA: Diagnosis not present

## 2024-04-09 DIAGNOSIS — D2239 Melanocytic nevi of other parts of face: Secondary | ICD-10-CM | POA: Diagnosis not present

## 2024-04-09 DIAGNOSIS — D485 Neoplasm of uncertain behavior of skin: Secondary | ICD-10-CM | POA: Diagnosis not present

## 2024-04-09 DIAGNOSIS — L820 Inflamed seborrheic keratosis: Secondary | ICD-10-CM | POA: Diagnosis not present
